# Patient Record
Sex: Male | Born: 1940
Health system: Southern US, Community
[De-identification: ages and names within clinical notes are randomized; demographics above are authoritative.]

## PROBLEM LIST (undated history)

## (undated) DIAGNOSIS — R319 Hematuria, unspecified: Secondary | ICD-10-CM

## (undated) DIAGNOSIS — E119 Type 2 diabetes mellitus without complications: Secondary | ICD-10-CM

## (undated) DIAGNOSIS — M1990 Unspecified osteoarthritis, unspecified site: Secondary | ICD-10-CM

## (undated) DIAGNOSIS — K429 Umbilical hernia without obstruction or gangrene: Secondary | ICD-10-CM

## (undated) DIAGNOSIS — E785 Hyperlipidemia, unspecified: Secondary | ICD-10-CM

## (undated) HISTORY — DX: Hematuria, unspecified: R31.9

## (undated) HISTORY — DX: Umbilical hernia without obstruction or gangrene: K42.9

## (undated) HISTORY — DX: Unspecified osteoarthritis, unspecified site: M19.90

## (undated) HISTORY — DX: Type 2 diabetes mellitus without complications: E11.9

## (undated) HISTORY — DX: Hyperlipidemia, unspecified: E78.5

---

## 2001-07-28 HISTORY — PX: CATARACT EXTRACTION: SUR2

## 2008-11-02 ENCOUNTER — Ambulatory Visit: Payer: Self-pay | Admitting: Gastroenterology

## 2008-11-02 LAB — HM COLONOSCOPY

## 2012-10-27 ENCOUNTER — Ambulatory Visit: Payer: Self-pay | Admitting: Surgery

## 2012-10-27 LAB — BASIC METABOLIC PANEL
Chloride: 103 mmol/L (ref 98–107)
Co2: 28 mmol/L (ref 21–32)
Creatinine: 1.18 mg/dL (ref 0.60–1.30)
EGFR (Non-African Amer.): >60
Glucose: 145 mg/dL — ABNORMAL HIGH (ref 65–99)
Osmolality: 277 (ref 275–301)
Potassium: 3.9 mmol/L (ref 3.5–5.1)
Sodium: 137 mmol/L (ref 136–145)

## 2012-11-03 ENCOUNTER — Ambulatory Visit: Payer: Self-pay | Admitting: Surgery

## 2013-07-28 HISTORY — PX: HERNIA REPAIR: SHX51

## 2014-05-22 LAB — PSA: PSA: 1.6

## 2014-11-13 DIAGNOSIS — E119 Type 2 diabetes mellitus without complications: Secondary | ICD-10-CM | POA: Diagnosis not present

## 2014-11-13 DIAGNOSIS — E784 Other hyperlipidemia: Secondary | ICD-10-CM | POA: Diagnosis not present

## 2014-11-13 DIAGNOSIS — Z1389 Encounter for screening for other disorder: Secondary | ICD-10-CM | POA: Diagnosis not present

## 2014-11-13 DIAGNOSIS — I10 Essential (primary) hypertension: Secondary | ICD-10-CM | POA: Diagnosis not present

## 2014-11-13 DIAGNOSIS — Z0000 Encounter for general adult medical examination without abnormal findings: Secondary | ICD-10-CM | POA: Diagnosis not present

## 2014-11-13 DIAGNOSIS — Z23 Encounter for immunization: Secondary | ICD-10-CM | POA: Diagnosis not present

## 2014-11-13 LAB — HEPATIC FUNCTION PANEL
ALT: 14 U/L (ref 10–40)
AST: 21 U/L (ref 14–40)

## 2014-11-13 LAB — BASIC METABOLIC PANEL
BUN: 19 mg/dL (ref 4–21)
CREATININE: 1.2 mg/dL (ref <=1.3)
GLUCOSE: 119 mg/dL

## 2014-11-13 LAB — LIPID PANEL
Cholesterol: 137 mg/dL (ref 0–200)
HDL: 34 mg/dL — AB (ref 35–70)
LDL Cholesterol: 85 mg/dL
TRIGLYCERIDES: 90 mg/dL (ref 40–160)

## 2014-11-13 LAB — HEMOGLOBIN A1C: HEMOGLOBIN A1C: 6.1 % — AB (ref 4.0–6.0)

## 2014-11-13 LAB — FECAL OCCULT BLOOD, GUAIAC: Fecal Occult Blood: NEGATIVE

## 2014-11-17 NOTE — Op Note (Signed)
PATIENT NAME:  Troy Rodriguez, Troy Rodriguez MR#:  [id number] DATE OF BIRTH:  July 15, 1941  DATE OF PROCEDURE:  11/03/2012  ATTENDING PHYSICIAN:  Dr. Marlyce Huge.  PREOPERATIVE DIAGNOSIS: Large umbilical hernia, with incarcerated hernia.  POSTOPERATIVE DIAGNOSIS: Large umbilical hernia, with incarcerated hernia.  PROCEDURE PERFORMED: Open umbilical hernia repair, with mesh and partial omentectomy.   ESTIMATED BLOOD LOSS: 30 mL.   SPECIMEN: Hernia sac and omentum.   INDICATION FOR SURGERY: The patient is a pleasant 74 year old male who had a large hernia with some skin thinning and possible skin breakdown, and risk for a possible hernia skin breakdown who presented to my clinic desiring a hernia repair.   I thus brought him to the operating room for hernia repair.   DETAILS OF PROCEDURE: The patient was brought to the operating room suite after informed consent was obtained. He was laid supine on the operating room table. His abdomen was prepped there. He was induced. Endotracheal tube was placed. General anesthesia was administered. His abdomen was prepped and draped in a standard surgical fashion. A time-out  was then performed, correctly identifying the patient's name, operative site and procedure to be performed.   Due to the size of the hernia I was afraid that I would not be able to get around the stalk using a transverse incision, therefore I made a lateral periumbilical incision. This was deepened down to the fascia. The hernia sac was easily identified. I then encircled the hernia sac and there was a very large sac, an approximately 2 x 2 cm fascial defect. I then carefully dissected the sac away from the herniated omentum. I then carefully reduced the herniated omentum and I did end up having to remove a small amount of it due to the fact that it had become friable trying to put it in with numerous attempts. I thus placed a Kelly around the omentum and tied off, removed it, and then ligated and  then tied with a 2-0 silk suture. Once all the omentum was then reduced  I cleared off the adjacent fascia and the underside of the fascial defect. I then used a 6 cm Ventralex patch to reinforce the hernia repair and then closed the hernia transversely using a  0 Ethibond suture, incorporating the mesh tails into the closure. All the sutures were placed and tied afterwards. The tails of the mesh were then excised. The wound was then irrigated. I then examined the skin, which was extremely thin, and I thought it had a high risk for necrosis. Therefore, I trimmed the excessive skin back to healthy skin. I then tacked the skin down to the repair using a 3-0 Vicryl. It did not approximate the bellybutton, but did not cosmetically have the appearance of a normal bellybutton, however I felt that putting compromised skin together would put the patient at risk for a wound infection and mesh infection.   I then closed the wound with staples. I then placed a sterile dressing and Tegaderm over the wound. The patient was then awakened, extubated and brought to the postanesthesia care unit. There were no immediate complications.   Needle, sponge, and instrument counts were correct at the end of the procedure.     ____________________________ Glena Norfolk. Lashauna Arpin, MD cal:dm D: 11/03/2012 14:16:49 ET T: 11/03/2012 14:45:29 ET JOB#: [id number]  cc: Harrell Gave A. Wilmot Quevedo, MD, <Dictator> Floyde Parkins MD ELECTRONICALLY SIGNED 11/03/2012 16:24

## 2014-12-08 DIAGNOSIS — I10 Essential (primary) hypertension: Secondary | ICD-10-CM | POA: Insufficient documentation

## 2014-12-08 DIAGNOSIS — Z0000 Encounter for general adult medical examination without abnormal findings: Secondary | ICD-10-CM | POA: Insufficient documentation

## 2014-12-08 DIAGNOSIS — E669 Obesity, unspecified: Secondary | ICD-10-CM | POA: Insufficient documentation

## 2014-12-08 DIAGNOSIS — R319 Hematuria, unspecified: Secondary | ICD-10-CM | POA: Insufficient documentation

## 2014-12-08 DIAGNOSIS — E118 Type 2 diabetes mellitus with unspecified complications: Secondary | ICD-10-CM | POA: Insufficient documentation

## 2014-12-08 DIAGNOSIS — E7849 Other hyperlipidemia: Secondary | ICD-10-CM | POA: Insufficient documentation

## 2014-12-08 DIAGNOSIS — Z1331 Encounter for screening for depression: Secondary | ICD-10-CM | POA: Insufficient documentation

## 2014-12-20 ENCOUNTER — Encounter: Payer: Self-pay | Admitting: *Deleted

## 2014-12-20 DIAGNOSIS — Z23 Encounter for immunization: Secondary | ICD-10-CM | POA: Insufficient documentation

## 2014-12-20 DIAGNOSIS — Z8639 Personal history of other endocrine, nutritional and metabolic disease: Secondary | ICD-10-CM | POA: Insufficient documentation

## 2014-12-20 DIAGNOSIS — K429 Umbilical hernia without obstruction or gangrene: Secondary | ICD-10-CM | POA: Insufficient documentation

## 2015-01-09 ENCOUNTER — Other Ambulatory Visit: Payer: Self-pay | Admitting: Family Medicine

## 2015-01-09 DIAGNOSIS — E785 Hyperlipidemia, unspecified: Secondary | ICD-10-CM

## 2015-01-15 ENCOUNTER — Ambulatory Visit: Payer: Medicare Other | Admitting: Urology

## 2015-01-18 ENCOUNTER — Encounter: Payer: Self-pay | Admitting: Urology

## 2015-01-18 ENCOUNTER — Ambulatory Visit (INDEPENDENT_AMBULATORY_CARE_PROVIDER_SITE_OTHER): Payer: Medicare Other | Admitting: Urology

## 2015-01-18 VITALS — BP 146/74 | HR 78 | Ht 73.0 in | Wt 289.6 lb

## 2015-01-18 DIAGNOSIS — R312 Other microscopic hematuria: Secondary | ICD-10-CM

## 2015-01-18 DIAGNOSIS — R3129 Other microscopic hematuria: Secondary | ICD-10-CM

## 2015-01-18 LAB — URINALYSIS, COMPLETE
Bilirubin, UA: NEGATIVE
Glucose, UA: NEGATIVE
Ketones, UA: NEGATIVE
Leukocytes, UA: NEGATIVE
Nitrite, UA: NEGATIVE
PH UA: 5 (ref 5.0–7.5)
Protein, UA: NEGATIVE
Specific Gravity, UA: 1.02 (ref 1.005–1.030)
Urobilinogen, Ur: 0.2 mg/dL (ref 0.2–1.0)

## 2015-01-18 LAB — MICROSCOPIC EXAMINATION

## 2015-01-18 NOTE — Progress Notes (Signed)
01/18/2015 9:09 AM   Troy Rodriguez 1941/06/23 [id number]  Referring provider: No referring provider defined for this encounter.  Chief Complaint  Patient presents with  . Hematuria    Refferal from Troy Miu MD    HPI: Troy Rodriguez is a 74 year old white male who is referred to [place] for blood in the urine.  Patient denies any gross hematuria.  I can only find uirne dips + for blood in his previous lab work.  He does have 3-10RBC's/hpf on today's UA.    His baseline line urinary symptoms are infrequent nocturia and infrequent intermittency.  He has not had a history of UTI's, stones or GU malignancies.  He does not have a FM Hx of stones, but his brother was diagnosed with PCa in his 59's.  Patient's most recent PSA from his PCP's office was 1.6 ng/mL on 05/22/2014.    He is currently not experiencing any flank pain, fevers, chills, nausea, vomiting or dysuria.    PSA History:    1.6 ng/mL on 05/22/2014  PMH: Past Medical History  Diagnosis Date  . Arthritis   . Diabetes   . HLD (hyperlipidemia)   . Umbilical hernia   . Hematuria     Surgical History: Past Surgical History  Procedure Laterality Date  . Cataract extraction Bilateral 2003  . Hernia repair  2015    Home Medications:    Medication List       This list is accurate as of: 01/18/15  9:09 AM.  Always use your most recent med list.               aspirin 81 MG tablet  Take 1 tablet by mouth daily.     atorvastatin 40 MG tablet  Commonly known as:  LIPITOR  Take 1 tablet by mouth daily.     pioglitazone 30 MG tablet  Commonly known as:  ACTOS  Take 1 tablet by mouth daily.     VYTORIN 10-40 MG per tablet  Generic drug:  ezetimibe-simvastatin  TAKE 1 TABLET BY MOUTH DAILY        Allergies: No Known Allergies  Family History: Family History  Problem Relation Age of Onset  . Prostate cancer Brother   . Kidney disease Neg Hx     Social History:  reports that he has quit smoking. His  smokeless tobacco use includes Chew. He reports that he does not drink alcohol or use illicit drugs.  ROS: Urological Symptom Review  Patient is experiencing the following symptoms: Get up at night to urinate Trouble starting stream   Review of Systems  Gastrointestinal (upper)  : Negative for upper GI symptoms  Gastrointestinal (lower) : Negative for lower GI symptoms  Constitutional : Negative for symptoms  Skin: Negative for skin symptoms  Eyes: Negative for eye symptoms  Ear/Nose/Throat : Negative for Ear/Nose/Throat symptoms  Hematologic/Lymphatic: Easy bruising  Cardiovascular : Negative for cardiovascular symptoms  Respiratory : Negative for respiratory symptoms  Endocrine: Negative for endocrine symptoms  Musculoskeletal: Joint pain  Neurological: Negative for neurological symptoms  Psychologic: Negative for psychiatric symptoms   Physical Exam: BP 146/74 mmHg  Pulse 78  Ht 6\' 1"  (1.854 m)  Wt 289 lb 9.6 oz (131.362 kg)  BMI 38.22 kg/m2  Constitutional:  Alert and oriented, No acute distress. HEENT: [place] AT, moist mucus membranes.  Trachea midline, no masses. Cardiovascular: No clubbing, cyanosis, or edema. Respiratory: Normal respiratory effort, no increased work of breathing. GI: Abdomen is soft,  nontender, nondistended, no abdominal masses GU: No CVA tenderness. Patient with uncircumcised phallus. Foreskin easily retracted  Urethral meatus is patent.  No penile discharge. Mild balanitis. Scrotum without lesions, cysts, rashes and/or edema.  Testicles are located scrotally bilaterally. No masses are appreciated in the testicles. Left and right epididymis are normal. Rectal: Patient with  normal sphincter tone. Perineum without scarring or rashes. No rectal masses are appreciated. Could not palpate the prostate due to buttocks tissue.   Skin: No rashes, bruises or suspicious lesions. Lymph: No cervical or inguinal adenopathy. Neurologic:  Grossly intact, no focal deficits, moving all 4 extremities. Psychiatric: Normal mood and affect.  Laboratory Data: Results for orders placed or performed in visit on 01/18/15  Microscopic Examination  Result Value Ref Range   WBC, UA 0-5 0 -  5 /hpf   RBC, UA 3-10 (A) 0 -  2 /hpf   Epithelial Cells (non renal) 0-10 0 - 10 /hpf   Bacteria, UA Few None seen/Few  Urinalysis, Complete  Result Value Ref Range   Specific Gravity, UA 1.020 1.005 - 1.030   pH, UA 5.0 5.0 - 7.5   Color, UA Yellow Yellow   Appearance Ur Clear Clear   Leukocytes, UA Negative Negative   Protein, UA Negative Negative/Trace   Glucose, UA Negative Negative   Ketones, UA Negative Negative   RBC, UA 2+ (A) Negative   Bilirubin, UA Negative Negative   Urobilinogen, Ur 0.2 0.2 - 1.0 mg/dL   Nitrite, UA Negative Negative   Microscopic Examination See below:    No results found for: WBC, HGB, HCT, MCV, PLT  Lab Results  Component Value Date   CREATININE 1.2 11/13/2014    Lab Results  Component Value Date   PSA 1.6 05/22/2014    No results found for: TESTOSTERONE  Lab Results  Component Value Date   HGBA1C 6.1* 11/13/2014    Urinalysis No results found for: COLORURINE, APPEARANCEUR, LABSPEC, PHURINE, GLUCOSEU, HGBUR, BILIRUBINUR, KETONESUR, PROTEINUR, UROBILINOGEN, NITRITE, LEUKOCYTESUR  Pertinent Imaging:  Assessment & Plan:    1. Microscopic hematuria:  Patient does not have a microscopic ua from the PCP's office, only +dips for blood.  He has 3-10RBC's/hpf on today's UA.   Explained to the patient that he does not yet meet the critera for a hematuria work up and that I would like to check his urine again next week.  If blood is found in the second UA, we will pursue a hematuria workup.  Explained to patient the causes of blood in the urine are as follows: stones, BPH, UTI's, damage to the urinary tract and/or cancer.  I will schedule  a CT Urogram with contrast material if he has RBC's/hpf on his  UA next week and that in rare instances, an allergic reaction can be serious and even life threatening with the injection of contrast material.   The patient denies any allergies to contrast, iodine and/or seafood and is not taking metformin.  - Urinalysis, Complete - CULTURE, URINE COMPREHENSIVE   No Follow-up on file.  Zara Council, [facility] [address] [place], [place] [address] [phone]

## 2015-01-20 LAB — CULTURE, URINE COMPREHENSIVE

## 2015-01-23 ENCOUNTER — Telehealth: Payer: Self-pay | Admitting: Urology

## 2015-01-23 ENCOUNTER — Other Ambulatory Visit: Payer: Medicare Other

## 2015-01-23 DIAGNOSIS — R3129 Other microscopic hematuria: Secondary | ICD-10-CM

## 2015-01-23 DIAGNOSIS — R312 Other microscopic hematuria: Secondary | ICD-10-CM | POA: Diagnosis not present

## 2015-01-23 LAB — URINALYSIS, COMPLETE
BILIRUBIN UA: NEGATIVE
GLUCOSE, UA: NEGATIVE
KETONES UA: NEGATIVE
Leukocytes, UA: NEGATIVE
Nitrite, UA: NEGATIVE
Protein, UA: NEGATIVE
SPEC GRAV UA: 1.02 (ref 1.005–1.030)
Urobilinogen, Ur: 0.2 mg/dL (ref 0.2–1.0)
pH, UA: 5.5 (ref 5.0–7.5)

## 2015-01-23 LAB — MICROSCOPIC EXAMINATION: Bacteria, UA: NONE SEEN

## 2015-01-23 NOTE — Telephone Encounter (Signed)
Patient still had micro heme.  We need to schedule a CT Urogram.

## 2015-01-24 NOTE — Telephone Encounter (Signed)
Office visit for the report before the cysto.

## 2015-01-24 NOTE — Telephone Encounter (Signed)
I placed the order for the CTscan to be scheduled, does the patient need follow up apt after? Or Cysto?

## 2015-01-24 NOTE — Telephone Encounter (Signed)
Can you please schedule pt and let them know. Thanks

## 2015-02-01 ENCOUNTER — Ambulatory Visit
Admission: RE | Admit: 2015-02-01 | Discharge: 2015-02-01 | Disposition: A | Discharge status: Home / Self Care | Payer: Medicare Other | Source: Ambulatory Visit | Attending: Urology | Admitting: Urology

## 2015-02-01 DIAGNOSIS — I700 Atherosclerosis of aorta: Secondary | ICD-10-CM | POA: Insufficient documentation

## 2015-02-01 DIAGNOSIS — K5730 Diverticulosis of large intestine without perforation or abscess without bleeding: Secondary | ICD-10-CM | POA: Insufficient documentation

## 2015-02-01 DIAGNOSIS — R312 Other microscopic hematuria: Secondary | ICD-10-CM | POA: Diagnosis present

## 2015-02-01 DIAGNOSIS — R319 Hematuria, unspecified: Secondary | ICD-10-CM | POA: Diagnosis not present

## 2015-02-01 DIAGNOSIS — R3129 Other microscopic hematuria: Secondary | ICD-10-CM

## 2015-02-01 MED ORDER — IOHEXOL 300 MG/ML  SOLN
125.0000 mL | Freq: Once | INTRAMUSCULAR | Status: AC | PRN
  Administered 2015-02-01: 125 mL via INTRAVENOUS

## 2015-02-08 NOTE — Telephone Encounter (Signed)
Lattie Haw scheduled patient for an ov for CT results on July 26th at 1:45. Called patient and informed him.

## 2015-02-20 ENCOUNTER — Ambulatory Visit (INDEPENDENT_AMBULATORY_CARE_PROVIDER_SITE_OTHER): Payer: Medicare Other | Admitting: Urology

## 2015-02-20 ENCOUNTER — Encounter: Payer: Self-pay | Admitting: Urology

## 2015-02-20 VITALS — BP 127/71 | HR 65 | Temp 97.6°F | Ht 73.0 in

## 2015-02-20 DIAGNOSIS — R312 Other microscopic hematuria: Secondary | ICD-10-CM | POA: Diagnosis not present

## 2015-02-20 DIAGNOSIS — R3129 Other microscopic hematuria: Secondary | ICD-10-CM

## 2015-02-20 DIAGNOSIS — R319 Hematuria, unspecified: Secondary | ICD-10-CM | POA: Diagnosis not present

## 2015-02-20 LAB — URINALYSIS, COMPLETE
Bilirubin, UA: NEGATIVE
Glucose, UA: NEGATIVE
Leukocytes, UA: NEGATIVE
NITRITE UA: NEGATIVE
Protein, UA: NEGATIVE
Specific Gravity, UA: 1.025 (ref 1.005–1.030)
Urobilinogen, Ur: 0.2 mg/dL (ref 0.2–1.0)
pH, UA: 5 (ref 5.0–7.5)

## 2015-02-20 LAB — MICROSCOPIC EXAMINATION: BACTERIA UA: NONE SEEN

## 2015-02-20 NOTE — Progress Notes (Signed)
4:55 PM   Troy Rodriguez April 28, 1941 [id number]  Referring provider: Juline Patch, MD [address] [place], [place] [address]  Chief Complaint  Patient presents with  . Follow-up    CT urogram for hematuria     HPI: Troy Rodriguez is a 74 year old white male who has completed his CT Urogram for the first part of his work up for microscopic hematuria. He has not experienced any gross hematuria.  He is on Actos for his diabetes and has been for ten years.    CT Urogram is reviewed with the patient and his wife.  The ureters were not found to be completely opacified on today's exam.  His baseline line urinary symptoms are infrequent nocturia and infrequent intermittency.  He has not had a history of UTI's, stones or GU malignancies.  He does not have a FM Hx of stones, but his brother was diagnosed with PCa in his 33's.  Patient's most recent PSA from his PCP's office was 1.6 ng/mL on 05/22/2014.    He is currently not experiencing any flank pain, fevers, chills, nausea, vomiting or dysuria.  He is still having microscopic hematuria on today's UA.   PSA History:    1.6 ng/mL on 05/22/2014  PMH: Past Medical History  Diagnosis Date  . Arthritis   . Diabetes   . HLD (hyperlipidemia)   . Umbilical hernia   . Hematuria   . Hypertension     Surgical History: Past Surgical History  Procedure Laterality Date  . Cataract extraction Bilateral 2003  . Hernia repair  2015    Home Medications:    Medication List       This list is accurate as of: 02/20/15  4:55 PM.  Always use your most recent med list.               aspirin 81 MG tablet  Take 1 tablet by mouth daily.     atorvastatin 40 MG tablet  Commonly known as:  LIPITOR  Take 1 tablet by mouth daily.     pioglitazone 30 MG tablet  Commonly known as:  ACTOS  Take 1 tablet by mouth daily.     VYTORIN 10-40 MG per tablet  Generic drug:  ezetimibe-simvastatin  TAKE 1 TABLET BY MOUTH DAILY         Allergies: No Known Allergies  Family History: Family History  Problem Relation Age of Onset  . Prostate cancer Brother   . Kidney disease Neg Hx     Social History:  reports that he has quit smoking. His smokeless tobacco use includes Chew. He reports that he does not drink alcohol or use illicit drugs.  ROS: Urological Symptom Review  Patient is experiencing the following symptoms: Get up at night to urinate Trouble starting stream   Review of Systems  Gastrointestinal (upper)  : Negative for upper GI symptoms  Gastrointestinal (lower) : Negative for lower GI symptoms  Constitutional : Negative for symptoms  Skin: Negative for skin symptoms  Eyes: Negative for eye symptoms  Ear/Nose/Throat : Negative for Ear/Nose/Throat symptoms  Hematologic/Lymphatic: Easy bruising  Cardiovascular : Negative for cardiovascular symptoms  Respiratory : Negative for respiratory symptoms  Endocrine: Negative for endocrine symptoms  Musculoskeletal: Joint pain  Neurological: Negative for neurological symptoms  Psychologic: Negative for psychiatric symptoms   Physical Exam: BP 127/71 mmHg  Pulse 65  Temp(Src) 97.6 F (36.4 C) (Oral)  Ht 6\' 1"  (1.854 m)  Constitutional:  Alert and oriented, No acute distress. HEENT: [place] AT, moist mucus membranes.  Trachea midline, no masses. Cardiovascular: No clubbing, cyanosis, or edema. Respiratory: Normal respiratory effort, no increased work of breathing. GI: Abdomen is soft, nontender, nondistended, no abdominal masses GU: No CVA tenderness.  Skin: No rashes, bruises or suspicious lesions. Lymph: No cervical or inguinal adenopathy. Neurologic: Grossly intact, no focal deficits, moving all 4 extremities. Psychiatric: Normal mood and affect.  Laboratory Data: Results for orders placed or performed in visit on 02/20/15  Microscopic Examination  Result Value Ref Range   WBC, UA 0-5 0 -  5 /hpf   RBC, UA 3-10 (A) 0  -  2 /hpf   Epithelial Cells (non renal) 0-10 0 - 10 /hpf   Bacteria, UA None seen None seen/Few  Urinalysis, Complete  Result Value Ref Range   Specific Gravity, UA 1.025 1.005 - 1.030   pH, UA 5.0 5.0 - 7.5   Color, UA Yellow Yellow   Appearance Ur Clear Clear   Leukocytes, UA Negative Negative   Protein, UA Negative Negative/Trace   Glucose, UA Negative Negative   Ketones, UA Trace (A) Negative   RBC, UA 1+ (A) Negative   Bilirubin, UA Negative Negative   Urobilinogen, Ur 0.2 0.2 - 1.0 mg/dL   Nitrite, UA Negative Negative   Microscopic Examination See below:     Results for orders placed or performed in visit on 02/20/15  Microscopic Examination  Result Value Ref Range   WBC, UA 0-5 0 -  5 /hpf   RBC, UA 3-10 (A) 0 -  2 /hpf   Epithelial Cells (non renal) 0-10 0 - 10 /hpf   Bacteria, UA None seen None seen/Few  Urinalysis, Complete  Result Value Ref Range   Specific Gravity, UA 1.025 1.005 - 1.030   pH, UA 5.0 5.0 - 7.5   Color, UA Yellow Yellow   Appearance Ur Clear Clear   Leukocytes, UA Negative Negative   Protein, UA Negative Negative/Trace   Glucose, UA Negative Negative   Ketones, UA Trace (A) Negative   RBC, UA 1+ (A) Negative   Bilirubin, UA Negative Negative   Urobilinogen, Ur 0.2 0.2 - 1.0 mg/dL   Nitrite, UA Negative Negative   Microscopic Examination See below:    No results found for: WBC, HGB, HCT, MCV, PLT  Lab Results  Component Value Date   CREATININE 1.2 11/13/2014    Lab Results  Component Value Date   PSA 1.6 05/22/2014    No results found for: TESTOSTERONE  Lab Results  Component Value Date   HGBA1C 6.1* 11/13/2014    Urinalysis    Component Value Date/Time   GLUCOSEU Negative 02/20/2015 1406   BILIRUBINUR Negative 02/20/2015 1406   NITRITE Negative 02/20/2015 1406   LEUKOCYTESUR Negative 02/20/2015 1406    Pertinent Imaging:  Assessment & Plan:    1. Microscopic hematuria:  Patient has completed his CT Urogram and no  etiology is found for his hematuria, but his ureters did not completely opacify on the exam.  He is at risk for bladder cancer with his long term use of Actos for his diabetes.    I discussed with the patient that a cystoscopy with RTG's would be the next step in his work up.    I described to the patient how the procedure is performed and the risk associated with the procedure, such as: Infection, bleeding, uncomfortableness for the first few days after the procedure, the possibility of a  biopsy of an area of concern in the ureters or bladder and the possibility of a stent placement.  He voiced his understanding of this procedure and his questions were answered.  He is instructed to stop his ASA at this time.    - Urinalysis, Complete - CULTURE, URINE COMPREHENSIVE   Return for cystoscopy with RTG's .  Zara Council, [facility] [address] [place], [place] [address] [phone]

## 2015-02-21 LAB — BASIC METABOLIC PANEL
BUN/Creatinine Ratio: 15 (ref 10–22)
BUN: 18 mg/dL (ref 8–27)
CALCIUM: 9.4 mg/dL (ref 8.6–10.2)
CHLORIDE: 102 mmol/L (ref 97–108)
CO2: 25 mmol/L (ref 18–29)
Creatinine, Ser: 1.18 mg/dL (ref 0.76–1.27)
GFR calc Af Amer: 69 mL/min/{1.73_m2} (ref >59)
GFR calc non Af Amer: 60 mL/min/{1.73_m2} (ref >59)
Glucose: 149 mg/dL — ABNORMAL HIGH (ref 65–99)
POTASSIUM: 4.1 mmol/L (ref 3.5–5.2)
Sodium: 140 mmol/L (ref 134–144)

## 2015-02-21 LAB — CBC WITH DIFFERENTIAL/PLATELET
BASOS: 1 %
Basophils Absolute: 0 10*3/uL (ref 0.0–0.2)
EOS (ABSOLUTE): 0.1 10*3/uL (ref 0.0–0.4)
Eos: 2 %
HEMATOCRIT: 45.6 % (ref 37.5–51.0)
HEMOGLOBIN: 15.1 g/dL (ref 12.6–17.7)
Immature Grans (Abs): 0 10*3/uL (ref 0.0–0.1)
Immature Granulocytes: 0 %
LYMPHS: 22 %
Lymphocytes Absolute: 1.1 10*3/uL (ref 0.7–3.1)
MCH: 30.1 pg (ref 26.6–33.0)
MCHC: 33.1 g/dL (ref 31.5–35.7)
MCV: 91 fL (ref 79–97)
MONOS ABS: 0.4 10*3/uL (ref 0.1–0.9)
Monocytes: 9 %
Neutrophils Absolute: 3.1 10*3/uL (ref 1.4–7.0)
Neutrophils: 66 %
Platelets: 176 10*3/uL (ref 150–379)
RBC: 5.01 x10E6/uL (ref 4.14–5.80)
RDW: 14.3 % (ref 12.3–15.4)
WBC: 4.7 10*3/uL (ref 3.4–10.8)

## 2015-02-22 ENCOUNTER — Other Ambulatory Visit: Payer: Self-pay

## 2015-02-22 ENCOUNTER — Telehealth: Payer: Self-pay

## 2015-02-22 DIAGNOSIS — N390 Urinary tract infection, site not specified: Secondary | ICD-10-CM

## 2015-02-22 LAB — CULTURE, URINE COMPREHENSIVE

## 2015-02-22 MED ORDER — AMOXICILLIN-POT CLAVULANATE 875-125 MG PO TABS: 1.0000 | ORAL_TABLET | Freq: Two times a day (BID) | ORAL | Status: AC

## 2015-02-22 NOTE — Telephone Encounter (Signed)
Spoke with pt and made aware of infection. Pt voiced understanding. Medication was called into pharmacy.

## 2015-02-22 NOTE — Telephone Encounter (Signed)
-----   Message from Nori Riis, PA-C sent at 02/22/2015  2:01 PM EDT ----- Patient has a +UCx.  They need to start Augmentin 875/125  one  twice daily for until his procedure and then we need to check a specimen in 3 to 5 days after they complete their antibiotics.

## 2015-02-23 ENCOUNTER — Encounter
Admission: RE | Admit: 2015-02-23 | Discharge: 2015-02-23 | Disposition: A | Discharge status: Home / Self Care | Payer: Medicare Other | Source: Ambulatory Visit | Attending: Urology | Admitting: Urology

## 2015-02-23 DIAGNOSIS — R312 Other microscopic hematuria: Secondary | ICD-10-CM | POA: Diagnosis not present

## 2015-02-23 DIAGNOSIS — E785 Hyperlipidemia, unspecified: Secondary | ICD-10-CM | POA: Diagnosis not present

## 2015-02-23 DIAGNOSIS — E119 Type 2 diabetes mellitus without complications: Secondary | ICD-10-CM | POA: Diagnosis not present

## 2015-02-23 DIAGNOSIS — M1990 Unspecified osteoarthritis, unspecified site: Secondary | ICD-10-CM | POA: Diagnosis not present

## 2015-02-23 DIAGNOSIS — Z720 Tobacco use: Secondary | ICD-10-CM | POA: Diagnosis not present

## 2015-02-23 DIAGNOSIS — N471 Phimosis: Secondary | ICD-10-CM | POA: Diagnosis not present

## 2015-02-23 DIAGNOSIS — Z87891 Personal history of nicotine dependence: Secondary | ICD-10-CM | POA: Diagnosis not present

## 2015-02-23 DIAGNOSIS — Z9841 Cataract extraction status, right eye: Secondary | ICD-10-CM | POA: Diagnosis not present

## 2015-02-23 DIAGNOSIS — Z79899 Other long term (current) drug therapy: Secondary | ICD-10-CM | POA: Diagnosis not present

## 2015-02-23 DIAGNOSIS — I10 Essential (primary) hypertension: Secondary | ICD-10-CM | POA: Diagnosis not present

## 2015-02-23 DIAGNOSIS — Z9842 Cataract extraction status, left eye: Secondary | ICD-10-CM | POA: Diagnosis not present

## 2015-02-23 DIAGNOSIS — Z01810 Encounter for preprocedural cardiovascular examination: Secondary | ICD-10-CM | POA: Diagnosis not present

## 2015-02-23 DIAGNOSIS — Z8042 Family history of malignant neoplasm of prostate: Secondary | ICD-10-CM | POA: Diagnosis not present

## 2015-02-23 DIAGNOSIS — Z841 Family history of disorders of kidney and ureter: Secondary | ICD-10-CM | POA: Diagnosis not present

## 2015-02-23 DIAGNOSIS — Z7982 Long term (current) use of aspirin: Secondary | ICD-10-CM | POA: Diagnosis not present

## 2015-02-23 DIAGNOSIS — R319 Hematuria, unspecified: Secondary | ICD-10-CM | POA: Diagnosis present

## 2015-02-23 NOTE — Patient Instructions (Signed)
  Your procedure is scheduled on: 02-26-15 Report to [facility] To find out your arrival time please call [phone] between 1PM - 3PM on 02-23-15   Remember: Instructions that are not followed completely may result in serious medical risk, up to and including death, or upon the discretion of your surgeon and anesthesiologist your surgery may need to be rescheduled.    __X__ 1. Do not eat food or drink liquids after midnight. No gum chewing or hard candies.     __X__ 2. No Alcohol for 24 hours before or after surgery.   ____ 3. Bring all medications with you on the day of surgery if instructed.    _X___ 4. Notify your doctor if there is any change in your medical condition     (cold, fever, infections).     Do not wear jewelry, make-up, hairpins, clips or nail polish.  Do not wear lotions, powders, or perfumes. You may wear deodorant.  Do not shave 48 hours prior to surgery. Men may shave face and neck.  Do not bring valuables to the hospital.    [facility] is not responsible for any belongings or valuables.               Contacts, dentures or bridgework may not be worn into surgery.  Leave your suitcase in the car. After surgery it may be brought to your room.  For patients admitted to the hospital, discharge time is determined by your  treatment team.   Patients discharged the day of surgery will not be allowed to drive home.   Please read over the following fact sheets that you were given:      _X___ Take these medicines the morning of surgery with A SIP OF WATER:    1. LIPITOR  2.   3.   4.  5.  6.  ____ Fleet Enema (as directed)   ____ Use CHG Soap as directed  ____ Use inhalers on the day of surgery  ____ Stop metformin 2 days prior to surgery    ____ Take 1/2 of usual insulin dose the night before surgery and none on the morning of surgery.   ____ Stop Coumadin/Plavix/aspirin-PT STOPPED ASPIRIN ON 02-20-15  ____ Stop  Anti-inflammatories-NO NSAIDS OR ASPIRIN PRODUCTS-TYLENOL OK   __X__ Stop supplements until after surgery-STOP FISH OIL NOW  ____ Bring C-Pap to the hospital.

## 2015-02-23 NOTE — Pre-Procedure Instructions (Signed)
Ekg sent to Dr Ronelle Nigh for review- Dr Ronelle Nigh reviewed and said pt ok to proceed with surgery.

## 2015-02-26 ENCOUNTER — Ambulatory Visit
Admission: RE | Admit: 2015-02-26 | Discharge: 2015-02-26 | Disposition: A | Discharge status: Home / Self Care | Payer: Medicare Other | Source: Ambulatory Visit | Attending: Urology | Admitting: Urology

## 2015-02-26 ENCOUNTER — Ambulatory Visit: Payer: Medicare Other | Admitting: Anesthesiology

## 2015-02-26 ENCOUNTER — Encounter: Payer: Self-pay | Admitting: Urology

## 2015-02-26 ENCOUNTER — Ambulatory Visit (INDEPENDENT_AMBULATORY_CARE_PROVIDER_SITE_OTHER): Payer: Medicare Other

## 2015-02-26 ENCOUNTER — Encounter
Admission: RE | Disposition: A | Discharge status: Home / Self Care | Payer: Self-pay | Source: Ambulatory Visit | Attending: Urology

## 2015-02-26 DIAGNOSIS — M1990 Unspecified osteoarthritis, unspecified site: Secondary | ICD-10-CM | POA: Diagnosis not present

## 2015-02-26 DIAGNOSIS — R319 Hematuria, unspecified: Secondary | ICD-10-CM

## 2015-02-26 DIAGNOSIS — Z8042 Family history of malignant neoplasm of prostate: Secondary | ICD-10-CM | POA: Insufficient documentation

## 2015-02-26 DIAGNOSIS — E785 Hyperlipidemia, unspecified: Secondary | ICD-10-CM | POA: Diagnosis not present

## 2015-02-26 DIAGNOSIS — N471 Phimosis: Secondary | ICD-10-CM | POA: Diagnosis not present

## 2015-02-26 DIAGNOSIS — E119 Type 2 diabetes mellitus without complications: Secondary | ICD-10-CM | POA: Insufficient documentation

## 2015-02-26 DIAGNOSIS — Z87891 Personal history of nicotine dependence: Secondary | ICD-10-CM | POA: Insufficient documentation

## 2015-02-26 DIAGNOSIS — Z79899 Other long term (current) drug therapy: Secondary | ICD-10-CM | POA: Insufficient documentation

## 2015-02-26 DIAGNOSIS — Z9841 Cataract extraction status, right eye: Secondary | ICD-10-CM | POA: Diagnosis not present

## 2015-02-26 DIAGNOSIS — Z9842 Cataract extraction status, left eye: Secondary | ICD-10-CM | POA: Diagnosis not present

## 2015-02-26 DIAGNOSIS — I10 Essential (primary) hypertension: Secondary | ICD-10-CM | POA: Insufficient documentation

## 2015-02-26 DIAGNOSIS — R339 Retention of urine, unspecified: Secondary | ICD-10-CM

## 2015-02-26 DIAGNOSIS — N35011 Post-traumatic bulbous urethral stricture: Secondary | ICD-10-CM | POA: Diagnosis not present

## 2015-02-26 DIAGNOSIS — R312 Other microscopic hematuria: Secondary | ICD-10-CM | POA: Insufficient documentation

## 2015-02-26 DIAGNOSIS — R311 Benign essential microscopic hematuria: Secondary | ICD-10-CM | POA: Diagnosis not present

## 2015-02-26 DIAGNOSIS — Z841 Family history of disorders of kidney and ureter: Secondary | ICD-10-CM | POA: Diagnosis not present

## 2015-02-26 DIAGNOSIS — Z720 Tobacco use: Secondary | ICD-10-CM | POA: Insufficient documentation

## 2015-02-26 DIAGNOSIS — Z7982 Long term (current) use of aspirin: Secondary | ICD-10-CM | POA: Diagnosis not present

## 2015-02-26 HISTORY — PX: CYSTOSCOPY W/ RETROGRADES: SHX1426

## 2015-02-26 LAB — GLUCOSE, CAPILLARY
GLUCOSE-CAPILLARY: 119 mg/dL — AB (ref 65–99)
Glucose-Capillary: 127 mg/dL — ABNORMAL HIGH (ref 65–99)

## 2015-02-26 LAB — BLADDER SCAN AMB NON-IMAGING

## 2015-02-26 SURGERY — CYSTOSCOPY, WITH RETROGRADE PYELOGRAM
Anesthesia: General | Wound class: Clean Contaminated

## 2015-02-26 MED ORDER — CEFAZOLIN SODIUM 1-5 GM-% IV SOLN
INTRAVENOUS | Status: AC
  Administered 2015-02-26: 1 g via INTRAVENOUS
  Filled 2015-02-26: qty 50

## 2015-02-26 MED ORDER — CEFAZOLIN SODIUM 1-5 GM-% IV SOLN
1.0000 g | Freq: Once | INTRAVENOUS | Status: AC
  Administered 2015-02-26 (×2): 1 g via INTRAVENOUS

## 2015-02-26 MED ORDER — IOTHALAMATE MEGLUMINE 43 % IV SOLN
INTRAVENOUS | Status: DC | PRN
  Administered 2015-02-26: 20 mL

## 2015-02-26 MED ORDER — FENTANYL CITRATE (PF) 100 MCG/2ML IJ SOLN
INTRAMUSCULAR | Status: DC | PRN
  Administered 2015-02-26: 50 ug via INTRAVENOUS

## 2015-02-26 MED ORDER — SODIUM CHLORIDE 0.9 % IV SOLN
INTRAVENOUS | Status: DC
  Administered 2015-02-26 (×2): via INTRAVENOUS

## 2015-02-26 MED ORDER — LIDOCAINE HCL 2 % EX GEL
1.0000 "application " | Freq: Once | CUTANEOUS | Status: AC
  Administered 2015-02-26: 1 via URETHRAL

## 2015-02-26 MED ORDER — ONDANSETRON HCL 4 MG/2ML IJ SOLN: 4.0000 mg | Freq: Once | INTRAMUSCULAR | Status: DC | PRN

## 2015-02-26 MED ORDER — LIDOCAINE HCL (CARDIAC) 20 MG/ML IV SOLN
INTRAVENOUS | Status: DC | PRN
  Administered 2015-02-26: 100 mg via INTRAVENOUS

## 2015-02-26 MED ORDER — GLYCOPYRROLATE 0.2 MG/ML IJ SOLN
INTRAMUSCULAR | Status: DC | PRN
  Administered 2015-02-26: 0.2 mg via INTRAVENOUS

## 2015-02-26 MED ORDER — FAMOTIDINE 20 MG PO TABS
ORAL_TABLET | ORAL | Status: AC
  Filled 2015-02-26: qty 1

## 2015-02-26 MED ORDER — FAMOTIDINE 20 MG PO TABS
20.0000 mg | ORAL_TABLET | Freq: Once | ORAL | Status: AC
  Administered 2015-02-26: 20 mg via ORAL

## 2015-02-26 MED ORDER — FENTANYL CITRATE (PF) 100 MCG/2ML IJ SOLN: 25.0000 ug | INTRAMUSCULAR | Status: DC | PRN

## 2015-02-26 MED ORDER — MIDAZOLAM HCL 2 MG/2ML IJ SOLN
INTRAMUSCULAR | Status: DC | PRN
  Administered 2015-02-26: 2 mg via INTRAVENOUS

## 2015-02-26 MED ORDER — PROPOFOL 10 MG/ML IV BOLUS
INTRAVENOUS | Status: DC | PRN
  Administered 2015-02-26: 200 mg via INTRAVENOUS

## 2015-02-26 MED ORDER — ONDANSETRON HCL 4 MG/2ML IJ SOLN
INTRAMUSCULAR | Status: DC | PRN
  Administered 2015-02-26: 4 mg via INTRAVENOUS

## 2015-02-26 SURGICAL SUPPLY — 26 items
BAG DRAIN CYSTO-URO LG1000N (MISCELLANEOUS) ×4 IMPLANT
CATH URETL 5X70 OPEN END (CATHETERS) ×4 IMPLANT
CONRAY 43 FOR UROLOGY 50M (MISCELLANEOUS) ×4 IMPLANT
CORD URO TURP 10FT (MISCELLANEOUS) ×4 IMPLANT
GLOVE BIO SURGEON STRL SZ 6.5 (GLOVE) ×3 IMPLANT
GLOVE BIO SURGEON STRL SZ7 (GLOVE) ×8 IMPLANT
GLOVE BIO SURGEONS STRL SZ 6.5 (GLOVE) ×1
GOWN STRL REUS W/ TWL LRG LVL3 (GOWN DISPOSABLE) ×4 IMPLANT
GOWN STRL REUS W/TWL LRG LVL3 (GOWN DISPOSABLE) ×4
JELLY LUB 2OZ STRL (MISCELLANEOUS) ×2
JELLY LUBE 2OZ STRL (MISCELLANEOUS) ×2 IMPLANT
KIT RM TURNOVER CYSTO AR (KITS) ×4 IMPLANT
PACK CYSTO AR (MISCELLANEOUS) ×4 IMPLANT
PAD GROUND ADULT SPLIT (MISCELLANEOUS) ×4 IMPLANT
PREP PVP WINGED SPONGE (MISCELLANEOUS) ×4 IMPLANT
PUMP SINGLE ACTION SAP (PUMP) ×4 IMPLANT
SENSORWIRE 0.038 NOT ANGLED (WIRE) ×8
SET CYSTO W/LG BORE CLAMP LF (SET/KITS/TRAYS/PACK) ×4 IMPLANT
SOL .9 NS 3000ML IRR  AL (IV SOLUTION) ×2
SOL .9 NS 3000ML IRR UROMATIC (IV SOLUTION) ×2 IMPLANT
STENT URET 6FRX24 CONTOUR (STENTS) IMPLANT
STENT URET 6FRX26 CONTOUR (STENTS) IMPLANT
SYRINGE IRR TOOMEY STRL 70CC (SYRINGE) ×4 IMPLANT
WATER STERILE IRR 1000ML POUR (IV SOLUTION) ×4 IMPLANT
WATER STERILE IRR 3000ML UROMA (IV SOLUTION) ×4 IMPLANT
WIRE SENSOR 0.038 NOT ANGLED (WIRE) ×4 IMPLANT

## 2015-02-26 NOTE — Anesthesia Procedure Notes (Signed)
Procedure Name: LMA Insertion Date/Time: 02/26/2015 7:37 AM Performed by: Silvana Newness Pre-anesthesia Checklist: Patient identified, Emergency Drugs available, Suction available, Patient being monitored and Timeout performed Patient Re-evaluated:Patient Re-evaluated prior to inductionOxygen Delivery Method: Circle system utilized Preoxygenation: Pre-oxygenation with 100% oxygen Intubation Type: IV induction Ventilation: Mask ventilation without difficulty LMA: LMA inserted LMA Size: 4.5 Number of attempts: 1 Placement Confirmation: positive ETCO2 and breath sounds checked- equal and bilateral Tube secured with: Tape Dental Injury: Teeth and Oropharynx as per pre-operative assessment

## 2015-02-26 NOTE — Discharge Instructions (Signed)
Cystoscopy, Care After Refer to this sheet in the next few weeks. These instructions provide you with information on caring for yourself after your procedure. Your caregiver may also give you more specific instructions. Your treatment has been planned according to current medical practices, but problems sometimes occur. Call your caregiver if you have any problems or questions after your procedure. HOME CARE INSTRUCTIONS  Things you can do to ease any discomfort after your procedure include:  Drinking enough water and fluids to keep your urine clear or pale yellow.  Taking a warm bath to relieve any burning feelings. SEEK IMMEDIATE MEDICAL CARE IF:   You have an increase in blood in your urine.  You notice blood clots in your urine.  You have difficulty passing urine.  You have the chills.  You have abdominal pain.  You have a fever or persistent symptoms for more than 2-3 days.  You have a fever and your symptoms suddenly get worse. MAKE SURE YOU:   Understand these instructions.  Will watch your condition.  Will get help right away if you are not doing well or get worse. Document Released: 01/31/2005 Document Revised: 03/16/2013 Document Reviewed: 01/05/2012 [facility] Patient Information 2015 [place], [place]. This information is not intended to replace advice given to you by your health care provider. Make sure you discuss any questions you have with your health care provider. AMBULATORY SURGERY  DISCHARGE INSTRUCTIONS   1) The drugs that you were given will stay in your system until tomorrow so for the next 24 hours you should not:  A) Drive an automobile B) Make any legal decisions C) Drink any alcoholic beverage   2) You may resume regular meals tomorrow.  Today it is better to start with liquids and gradually work up to solid foods.  You may eat anything you prefer, but it is better to start with liquids, then soup and crackers, and gradually work up to solid  foods.   3) Please notify your doctor immediately if you have any unusual bleeding, trouble breathing, redness and pain at the surgery site, drainage, fever, or pain not relieved by medication. 4)   5) Your post-operative visit with Dr.                                     is: Date:                        Time:    Please call to schedule your post-operative visit.  6) Additional Instructions: 7)

## 2015-02-26 NOTE — Anesthesia Preprocedure Evaluation (Signed)
Anesthesia Evaluation  Patient identified by MRN, date of birth, ID band Patient awake    Reviewed: Allergy & Precautions, NPO status , Patient's Chart, lab work & pertinent test results  History of Anesthesia Complications (+) history of anesthetic complications  Airway Mallampati: II  TM Distance: >3 FB Neck ROM: Full    Dental  (+) Partial Upper   Pulmonary former smoker (quit x 40 yrs, still chews),          Cardiovascular hypertension (pt denies),     Neuro/Psych    GI/Hepatic   Endo/Other  diabetes, Type 2, Oral Hypoglycemic Agents  Renal/GU      Musculoskeletal   Abdominal   Peds  Hematology   Anesthesia Other Findings   Reproductive/Obstetrics                             Anesthesia Physical Anesthesia Plan  ASA: III  Anesthesia Plan: General   Post-op Pain Management:    Induction: Intravenous  Airway Management Planned: LMA  Additional Equipment:   Intra-op Plan:   Post-operative Plan:   Informed Consent: I have reviewed the patients History and Physical, chart, labs and discussed the procedure including the risks, benefits and alternatives for the proposed anesthesia with the patient or authorized representative who has indicated his/her understanding and acceptance.     Plan Discussed with:   Anesthesia Plan Comments:         Anesthesia Quick Evaluation

## 2015-02-26 NOTE — Interval H&P Note (Signed)
History and Physical Interval Note:  02/26/2015 7:27 AM  Troy Rodriguez  has presented today for surgery, with the diagnosis of MICRO HEMATURIA  The various methods of treatment have been discussed with the patient and family. After consideration of risks, benefits and other options for treatment, the patient has consented to  Procedure(s): CYSTOSCOPY WITH RETROGRADE PYELOGRAM (Bilateral) CYSTOSCOPY WITH BIOPSY (N/A) CYSTOSCOPY WITH STENT PLACEMENT (Bilateral) as a surgical intervention .  The patient's history has been reviewed, patient examined, no change in status, stable for surgery.  I have reviewed the patient's chart and labs.  Questions were answered to the patient's satisfaction.    RRR CTAB   Hollice Espy

## 2015-02-26 NOTE — Op Note (Signed)
Date of procedure: 02/26/2015  Preoperative diagnosis:  1. Microscopic hematuria   Postoperative diagnosis:  1. Same as above   Procedure: 1. Cystoscopy 2. Bilateral retrograde pyelogram  Surgeon: Hollice Espy, MD  Anesthesia: General  Complications: None  Intraoperative findings: No bladder or ureteral abnormalities identified, slight bulbar stricture.  Mild phimosis.  EBL: Minimal  Specimens: None  Drains: None  Indication: Troy Rodriguez is a 74 y.o. patient with microscopic hematuria who underwent CT urogram with incomplete opacification of the ureters.  After reviewing the management options for treatment, he elected to proceed with the above surgical procedure(s). We have discussed the potential benefits and risks of the procedure, side effects of the proposed treatment, the likelihood of the patient achieving the goals of the procedure, and any potential problems that might occur during the procedure or recuperation. Informed consent has been obtained.  Description of procedure:  The patient was taken to the operating room and general anesthesia was induced.  The patient was placed in the dorsal lithotomy position, prepped and draped in the usual sterile fashion, and preoperative antibiotics were administered. A preoperative time-out was performed.   Prior to placing the scope, mild phimosis in slightly inflamed foreskin was noted. His was able to be mostly retracted for the purpose of the procedure. A rigid 80 French cystoscope was advanced per urethra into the bladder. Of note he had some mild bulbar urethral narrowing which was able to be passed with the scope. This mucosa was somewhat friable and bled with manipulation/passage of the scope. The prostate was relatively short with bilobar coaptation. The bladder neck was slightly elevated.  The bladder was then carefully inspected which had normal-appearing mucosa, mildly trabeculated without masses, lesions, or  ulcerations.   There were no stones or tumors identified. The trigone was normal with clear reflux of urine from each UO. Attention was then turned to the right ureteral orifice which was cannulated using a sensor wire and a 5 [place] open-ended ureteral catheter. Contrast was then injected through the 5 [place] cathetered just within the UO to complete a retrograde pyelogram. This revealed a very delicate appearing ureter without any filling defects. The upper tract collecting system was normal and decompressed without any filling defects. The same exact procedure was performed on the left side again revealing a delicate ureter and normal upper tract collecting system.  The bladder was then drained and the scope was removed. The patient was repositioned in the supine position, reversed from anesthesia, taken to the PACU in stable condition. There were no complications in this case.  Hollice Espy, M.D.

## 2015-02-26 NOTE — Anesthesia Postprocedure Evaluation (Signed)
  Anesthesia Post-op Note  Patient: Troy Rodriguez  Procedure(s) Performed: Procedure(s): CYSTOSCOPY WITH RETROGRADE PYELOGRAM (Bilateral)  Anesthesia type:General  Patient location: PACU  Post pain: Pain level controlled  Post assessment: Post-op Vital signs reviewed, Patient's Cardiovascular Status Stable, Respiratory Function Stable, Patent Airway and No signs of Nausea or vomiting  Post vital signs: Reviewed and stable  Last Vitals:  Filed Vitals:   02/26/15 0714  BP: 134/54  Temp: 36.9 C  Resp: 16    Level of consciousness: awake, alert  and patient cooperative  Complications: No apparent anesthesia complications

## 2015-02-26 NOTE — Transfer of Care (Signed)
Immediate Anesthesia Transfer of Care Note  Patient: Troy Rodriguez  Procedure(s) Performed: Procedure(s): CYSTOSCOPY WITH RETROGRADE PYELOGRAM (Bilateral)  Patient Location: PACU  Anesthesia Type:General  Level of Consciousness: awake, alert , oriented and patient cooperative  Airway & Oxygen Therapy: Patient Spontanous Breathing and Patient connected to face mask oxygen  Post-op Assessment: Report given to RN, Post -op Vital signs reviewed and stable and Patient moving all extremities X 4  Post vital signs: Reviewed and stable  Last Vitals:  Pulse: 50 BP:134/68 97% Respirations: 13  Complications: No apparent anesthesia complications

## 2015-02-26 NOTE — H&P (View-Only) (Signed)
4:55 PM   CHALES Rodriguez 12/10/1940 [id number]  Referring provider: Juline Patch, MD [address] [place], [place] [address]  Chief Complaint  Patient presents with  . Follow-up    CT urogram for hematuria     HPI: Mr. Troy Rodriguez is a 74 year old white male who has completed his CT Urogram for the first part of his work up for microscopic hematuria. He has not experienced any gross hematuria.  He is on Actos for his diabetes and has been for ten years.    CT Urogram is reviewed with the patient and his wife.  The ureters were not found to be completely opacified on today's exam.  His baseline line urinary symptoms are infrequent nocturia and infrequent intermittency.  He has not had a history of UTI's, stones or GU malignancies.  He does not have a FM Hx of stones, but his brother was diagnosed with PCa in his 51's.  Patient's most recent PSA from his PCP's office was 1.6 ng/mL on 05/22/2014.    He is currently not experiencing any flank pain, fevers, chills, nausea, vomiting or dysuria.  He is still having microscopic hematuria on today's UA.   PSA History:    1.6 ng/mL on 05/22/2014  PMH: Past Medical History  Diagnosis Date  . Arthritis   . Diabetes   . HLD (hyperlipidemia)   . Umbilical hernia   . Hematuria   . Hypertension     Surgical History: Past Surgical History  Procedure Laterality Date  . Cataract extraction Bilateral 2003  . Hernia repair  2015    Home Medications:    Medication List       This list is accurate as of: 02/20/15  4:55 PM.  Always use your most recent med list.               aspirin 81 MG tablet  Take 1 tablet by mouth daily.     atorvastatin 40 MG tablet  Commonly known as:  LIPITOR  Take 1 tablet by mouth daily.     pioglitazone 30 MG tablet  Commonly known as:  ACTOS  Take 1 tablet by mouth daily.     VYTORIN 10-40 MG per tablet  Generic drug:  ezetimibe-simvastatin  TAKE 1 TABLET BY MOUTH DAILY         Allergies: No Known Allergies  Family History: Family History  Problem Relation Age of Onset  . Prostate cancer Brother   . Kidney disease Neg Hx     Social History:  reports that he has quit smoking. His smokeless tobacco use includes Chew. He reports that he does not drink alcohol or use illicit drugs.  ROS: Urological Symptom Review  Patient is experiencing the following symptoms: Get up at night to urinate Trouble starting stream   Review of Systems  Gastrointestinal (upper)  : Negative for upper GI symptoms  Gastrointestinal (lower) : Negative for lower GI symptoms  Constitutional : Negative for symptoms  Skin: Negative for skin symptoms  Eyes: Negative for eye symptoms  Ear/Nose/Throat : Negative for Ear/Nose/Throat symptoms  Hematologic/Lymphatic: Easy bruising  Cardiovascular : Negative for cardiovascular symptoms  Respiratory : Negative for respiratory symptoms  Endocrine: Negative for endocrine symptoms  Musculoskeletal: Joint pain  Neurological: Negative for neurological symptoms  Psychologic: Negative for psychiatric symptoms   Physical Exam: BP 127/71 mmHg  Pulse 65  Temp(Src) 97.6 F (36.4 C) (Oral)  Ht 6\' 1"  (1.854 m)  Constitutional:  Alert and oriented, No acute distress. HEENT: [place] AT, moist mucus membranes.  Trachea midline, no masses. Cardiovascular: No clubbing, cyanosis, or edema. Respiratory: Normal respiratory effort, no increased work of breathing. GI: Abdomen is soft, nontender, nondistended, no abdominal masses GU: No CVA tenderness.  Skin: No rashes, bruises or suspicious lesions. Lymph: No cervical or inguinal adenopathy. Neurologic: Grossly intact, no focal deficits, moving all 4 extremities. Psychiatric: Normal mood and affect.  Laboratory Data: Results for orders placed or performed in visit on 02/20/15  Microscopic Examination  Result Value Ref Range   WBC, UA 0-5 0 -  5 /hpf   RBC, UA 3-10 (A) 0  -  2 /hpf   Epithelial Cells (non renal) 0-10 0 - 10 /hpf   Bacteria, UA None seen None seen/Few  Urinalysis, Complete  Result Value Ref Range   Specific Gravity, UA 1.025 1.005 - 1.030   pH, UA 5.0 5.0 - 7.5   Color, UA Yellow Yellow   Appearance Ur Clear Clear   Leukocytes, UA Negative Negative   Protein, UA Negative Negative/Trace   Glucose, UA Negative Negative   Ketones, UA Trace (A) Negative   RBC, UA 1+ (A) Negative   Bilirubin, UA Negative Negative   Urobilinogen, Ur 0.2 0.2 - 1.0 mg/dL   Nitrite, UA Negative Negative   Microscopic Examination See below:     Results for orders placed or performed in visit on 02/20/15  Microscopic Examination  Result Value Ref Range   WBC, UA 0-5 0 -  5 /hpf   RBC, UA 3-10 (A) 0 -  2 /hpf   Epithelial Cells (non renal) 0-10 0 - 10 /hpf   Bacteria, UA None seen None seen/Few  Urinalysis, Complete  Result Value Ref Range   Specific Gravity, UA 1.025 1.005 - 1.030   pH, UA 5.0 5.0 - 7.5   Color, UA Yellow Yellow   Appearance Ur Clear Clear   Leukocytes, UA Negative Negative   Protein, UA Negative Negative/Trace   Glucose, UA Negative Negative   Ketones, UA Trace (A) Negative   RBC, UA 1+ (A) Negative   Bilirubin, UA Negative Negative   Urobilinogen, Ur 0.2 0.2 - 1.0 mg/dL   Nitrite, UA Negative Negative   Microscopic Examination See below:    No results found for: WBC, HGB, HCT, MCV, PLT  Lab Results  Component Value Date   CREATININE 1.2 11/13/2014    Lab Results  Component Value Date   PSA 1.6 05/22/2014    No results found for: TESTOSTERONE  Lab Results  Component Value Date   HGBA1C 6.1* 11/13/2014    Urinalysis    Component Value Date/Time   GLUCOSEU Negative 02/20/2015 1406   BILIRUBINUR Negative 02/20/2015 1406   NITRITE Negative 02/20/2015 1406   LEUKOCYTESUR Negative 02/20/2015 1406    Pertinent Imaging:  Assessment & Plan:    1. Microscopic hematuria:  Patient has completed his CT Urogram and no  etiology is found for his hematuria, but his ureters did not completely opacify on the exam.  He is at risk for bladder cancer with his long term use of Actos for his diabetes.    I discussed with the patient that a cystoscopy with RTG's would be the next step in his work up.    I described to the patient how the procedure is performed and the risk associated with the procedure, such as: Infection, bleeding, uncomfortableness for the first few days after the procedure, the possibility of a  biopsy of an area of concern in the ureters or bladder and the possibility of a stent placement.  He voiced his understanding of this procedure and his questions were answered.  He is instructed to stop his ASA at this time.    - Urinalysis, Complete - CULTURE, URINE COMPREHENSIVE   Return for cystoscopy with RTG's .  Zara Council, [facility] [address] [place], [place] [address] [phone]

## 2015-02-26 NOTE — Interval H&P Note (Signed)
History and Physical Interval Note:  02/26/2015 7:21 AM  Troy Rodriguez  has presented today for surgery, with the diagnosis of MICRO HEMATURIA  The various methods of treatment have been discussed with the patient and family. After consideration of risks, benefits and other options for treatment, the patient has consented to  Procedure(s): CYSTOSCOPY WITH RETROGRADE PYELOGRAM (Bilateral) CYSTOSCOPY WITH BIOPSY (N/A) CYSTOSCOPY WITH STENT PLACEMENT (Bilateral) as a surgical intervention .  The patient's history has been reviewed, patient examined, no change in status, stable for surgery.  I have reviewed the patient's chart and labs.  Questions were answered to the patient's satisfaction.     Hollice Espy

## 2015-02-26 NOTE — Progress Notes (Signed)
Patient had surgery today with Dr. Erlene Quan, Cysto Bilateral Retogrades.  He called this afternoon stating that he had not urinated since surgery and was in some discomfort. Patient was instructed to come to the office for a PVR. Patient presents at the office in supra pubic discomfort stating he still cannot void, his bladder scan was 872ml. After speaking with Zara Council, PA I was instructed to place a foley catheter.   Simple Catheter Placement  Due to urinary retention patient is present today for a foley cath placement.  Patient was cleaned and prepped in a sterile fashion with betadine and lidocaine jelly 2% was instilled into the urethra.  A 16 coude FR foley catheter was inserted, urine return was noted  948ml, urine was dark reddish brown in color.  The balloon was filled with 10cc of sterile water.  A leg bag was attached for drainage. Patient was also given a night bag to take home and was given instruction on how to change from one bag to another.  Patient was given instruction on proper catheter care.  Patient tolerated well, no complications were noted   Preformed by: Fonnie Jarvis, CMA  Will call patient for follow up

## 2015-02-28 ENCOUNTER — Ambulatory Visit: Payer: Medicare Other

## 2015-02-28 DIAGNOSIS — R339 Retention of urine, unspecified: Secondary | ICD-10-CM

## 2015-02-28 NOTE — Progress Notes (Signed)
Fill and Pull Catheter Removal  Patient is present today for a catheter removal.  Patient was cleaned and prepped in a sterile fashion 152ml of sterile water/ saline was instilled into the bladder when the patient felt the urge to urinate. 95ml of water was then drained from the balloon.  A 16FR coude foley cath was removed from the bladder no complications were noted .  Patient was then given some time to void on their own.  Patient can void  172ml on their own after some time.  Patient tolerated well.  Preformed by: Toniann Fail, LPN

## 2015-03-02 ENCOUNTER — Other Ambulatory Visit: Payer: Medicare Other

## 2015-03-02 DIAGNOSIS — N390 Urinary tract infection, site not specified: Secondary | ICD-10-CM | POA: Diagnosis not present

## 2015-03-05 LAB — CULTURE, URINE COMPREHENSIVE

## 2015-03-06 ENCOUNTER — Telehealth: Payer: Self-pay | Admitting: Urology

## 2015-03-06 MED ORDER — SULFAMETHOXAZOLE-TRIMETHOPRIM 800-160 MG PO TABS: 1.0000 | ORAL_TABLET | Freq: Two times a day (BID) | ORAL | Status: DC

## 2015-03-06 NOTE — Telephone Encounter (Signed)
Spoke with pt who stated he was having burning on urination with back pain. Made him aware of medication at pharmacy. Pt voiced understanding.

## 2015-03-06 NOTE — Telephone Encounter (Signed)
Please see positive urine culture result for Mr. Troy Rodriguez. I assumed that he was having symptoms and therefore dropped off a urine, can you please clarify?  He should be treated with Bactrim DS twice a day 7 days if he is symptomatic.  Order placed.  Hollice Espy, MD

## 2015-04-30 ENCOUNTER — Other Ambulatory Visit: Payer: Self-pay | Admitting: Family Medicine

## 2015-04-30 DIAGNOSIS — E785 Hyperlipidemia, unspecified: Secondary | ICD-10-CM

## 2015-06-30 ENCOUNTER — Other Ambulatory Visit: Payer: Self-pay | Admitting: Family Medicine

## 2015-07-02 ENCOUNTER — Other Ambulatory Visit: Payer: Self-pay

## 2015-08-06 ENCOUNTER — Ambulatory Visit: Payer: Medicare Other | Admitting: Family Medicine

## 2015-08-08 ENCOUNTER — Encounter: Payer: Self-pay | Admitting: Family Medicine

## 2015-08-08 ENCOUNTER — Ambulatory Visit (INDEPENDENT_AMBULATORY_CARE_PROVIDER_SITE_OTHER): Payer: PPO | Admitting: Family Medicine

## 2015-08-08 VITALS — BP 130/60 | HR 60 | Ht 73.0 in | Wt 294.0 lb

## 2015-08-08 DIAGNOSIS — E119 Type 2 diabetes mellitus without complications: Secondary | ICD-10-CM

## 2015-08-08 DIAGNOSIS — E785 Hyperlipidemia, unspecified: Secondary | ICD-10-CM

## 2015-08-08 DIAGNOSIS — E784 Other hyperlipidemia: Secondary | ICD-10-CM | POA: Diagnosis not present

## 2015-08-08 DIAGNOSIS — E7849 Other hyperlipidemia: Secondary | ICD-10-CM

## 2015-08-08 MED ORDER — LIRAGLUTIDE 18 MG/3ML ~~LOC~~ SOPN: 0.6000 mL | PEN_INJECTOR | Freq: Every day | SUBCUTANEOUS | Status: DC

## 2015-08-08 MED ORDER — ATORVASTATIN CALCIUM 40 MG PO TABS: 40.0000 mg | ORAL_TABLET | Freq: Every day | ORAL | Status: DC

## 2015-08-08 NOTE — Progress Notes (Signed)
Name: Troy Rodriguez   MRN: [id number]    DOB: 11/18/1940   Date:08/08/2015       Progress Note  Subjective  Chief Complaint  Chief Complaint  Patient presents with  . Hyperlipidemia  . Diabetes    Hyperlipidemia This is a chronic problem. The current episode started more than 1 year ago. The problem is controlled. Recent lipid tests were reviewed and are low. Exacerbating diseases include obesity. He has no history of chronic renal disease, diabetes, hypothyroidism, liver disease or nephrotic syndrome. There are no known factors aggravating his hyperlipidemia. Pertinent negatives include no chest pain, focal sensory loss, focal weakness, leg pain, myalgias or shortness of breath. Current antihyperlipidemic treatment includes statins. The current treatment provides mild improvement of lipids. There are no compliance problems.  Risk factors for coronary artery disease include diabetes mellitus and dyslipidemia.  Diabetes He presents for his follow-up diabetic visit. He has type 2 diabetes mellitus. His disease course has been stable. There are no hypoglycemic associated symptoms. Pertinent negatives for hypoglycemia include no dizziness, headaches or nervousness/anxiousness. Pertinent negatives for diabetes include no blurred vision, no chest pain, no fatigue, no foot paresthesias, no foot ulcerations, no polydipsia, no polyphagia, no polyuria, no visual change, no weakness and no weight loss. There are no hypoglycemic complications. Symptoms are stable. Pertinent negatives for diabetic complications include no autonomic neuropathy, CVA, heart disease, impotence, nephropathy, peripheral neuropathy, PVD or retinopathy. Risk factors for coronary artery disease include diabetes mellitus and dyslipidemia. Current diabetic treatment includes oral agent (monotherapy). He is compliant with treatment all of the time. His weight is stable. He is following a generally healthy diet. He has not had a previous visit  with a dietitian. He participates in exercise intermittently. His breakfast blood glucose is taken between 8-9 am. His breakfast blood glucose range is generally 110-130 mg/dl. An ACE inhibitor/angiotensin II receptor blocker is not being taken. He does not see a podiatrist.Eye exam is current.    No problem-specific assessment & plan notes found for this encounter.   Past Medical History  Diagnosis Date  . Arthritis   . Diabetes ([place])   . HLD (hyperlipidemia)   . Umbilical hernia   . Hematuria     Past Surgical History  Procedure Laterality Date  . Cataract extraction Bilateral 2003  . Hernia repair  2015  . Cystoscopy w/ retrogrades Bilateral 02/26/2015    Procedure: CYSTOSCOPY WITH RETROGRADE PYELOGRAM;  Surgeon: Hollice Espy, MD;  Location: [facility] ORS;  Service: Urology;  Laterality: Bilateral;    Family History  Problem Relation Age of Onset  . Prostate cancer Brother   . Kidney disease Neg Hx     Social History   Social History  . Marital Status: Married    Spouse Name: N/A  . Number of Children: N/A  . Years of Education: N/A   Occupational History  . Not on file.   Social History Main Topics  . Smoking status: Former Smoker    Types: Cigarettes    Quit date: 02/23/1975  . Smokeless tobacco: Current User    Types: Chew     Comment: smoked for only 6 mos out of his whole life  . Alcohol Use: No     Comment: no drinking in the past 3 months  . Drug Use: No  . Sexual Activity: Not Currently   Other Topics Concern  . Not on file   Social History Narrative   Caffeine use:2 per day.    No Known  Allergies   Review of Systems  Constitutional: Negative for fever, chills, weight loss, malaise/fatigue and fatigue.  HENT: Negative for ear discharge, ear pain and sore throat.   Eyes: Negative for blurred vision.  Respiratory: Negative for cough, sputum production, shortness of breath and wheezing.   Cardiovascular: Negative for chest pain, palpitations and  leg swelling.  Gastrointestinal: Negative for heartburn, nausea, abdominal pain, diarrhea, constipation, blood in stool and melena.  Genitourinary: Negative for dysuria, urgency, frequency, hematuria and impotence.  Musculoskeletal: Negative for myalgias, back pain, joint pain and neck pain.  Skin: Negative for rash.  Neurological: Negative for dizziness, tingling, sensory change, focal weakness, weakness and headaches.  Endo/Heme/Allergies: Negative for environmental allergies, polydipsia and polyphagia. Does not bruise/bleed easily.  Psychiatric/Behavioral: Negative for depression and suicidal ideas. The patient is not nervous/anxious and does not have insomnia.      Objective  Filed Vitals:   08/08/15 0806  BP: 130/60  Pulse: 60  Height: 6\' 1"  (1.854 m)  Weight: 294 lb (133.358 kg)    Physical Exam  Constitutional: He is oriented to person, place, and time and well-developed, well-nourished, and in no distress.  HENT:  Head: Normocephalic.  Right Ear: External ear normal.  Left Ear: External ear normal.  Nose: Nose normal.  Mouth/Throat: Oropharynx is clear and moist.  Eyes: Conjunctivae and EOM are normal. Pupils are equal, round, and reactive to light. Right eye exhibits no discharge. Left eye exhibits no discharge. No scleral icterus.  Neck: Normal range of motion. Neck supple. No JVD present. No tracheal deviation present. No thyromegaly present.  Cardiovascular: Normal rate, regular rhythm, normal heart sounds and intact distal pulses.  Exam reveals no gallop and no friction rub.   No murmur heard. Pulmonary/Chest: Breath sounds normal. No respiratory distress. He has no wheezes. He has no rales.  Abdominal: Soft. Bowel sounds are normal. He exhibits no mass. There is no hepatosplenomegaly. There is no tenderness. There is no rebound, no guarding and no CVA tenderness.  Musculoskeletal: Normal range of motion. He exhibits no edema or tenderness.  Lymphadenopathy:    He  has no cervical adenopathy.  Neurological: He is alert and oriented to person, place, and time. He has normal sensation, normal strength, normal reflexes and intact cranial nerves. No cranial nerve deficit.  Skin: Skin is warm. No rash noted.  Psychiatric: Mood and affect normal.  Nursing note and vitals reviewed.     Assessment & Plan  Problem List Items Addressed This Visit      Endocrine   Diabetes mellitus, type 2 ([place]) - Primary   Relevant Medications   atorvastatin (LIPITOR) 40 MG tablet   Other Relevant Orders   Renal Function Panel     Other   Familial multiple lipoprotein-type hyperlipidemia   Relevant Medications   atorvastatin (LIPITOR) 40 MG tablet   Other Relevant Orders   Hepatic function panel    Other Visit Diagnoses    Hyperlipidemia        Relevant Medications    atorvastatin (LIPITOR) 40 MG tablet    Other Relevant Orders    Renal Function Panel    Lipid Profile    Hepatic function panel         Dr. Otilio Miu [facility]  08/08/2015

## 2015-08-09 LAB — RENAL FUNCTION PANEL
Albumin: 4.1 g/dL (ref 3.5–4.8)
BUN / CREAT RATIO: 13 (ref 10–22)
BUN: 16 mg/dL (ref 8–27)
CO2: 26 mmol/L (ref 18–29)
CREATININE: 1.25 mg/dL (ref 0.76–1.27)
Calcium: 9.4 mg/dL (ref 8.6–10.2)
Chloride: 99 mmol/L (ref 96–106)
GFR, EST AFRICAN AMERICAN: 64 mL/min/{1.73_m2} (ref >59)
GFR, EST NON AFRICAN AMERICAN: 56 mL/min/{1.73_m2} — AB (ref >59)
Glucose: 127 mg/dL — ABNORMAL HIGH (ref 65–99)
Phosphorus: 2.8 mg/dL (ref 2.5–4.5)
Potassium: 4.8 mmol/L (ref 3.5–5.2)
SODIUM: 141 mmol/L (ref 134–144)

## 2015-08-09 LAB — LIPID PANEL
CHOL/HDL RATIO: 5.7 ratio — AB (ref 0.0–5.0)
Cholesterol, Total: 166 mg/dL (ref 100–199)
HDL: 29 mg/dL — ABNORMAL LOW (ref >39)
LDL CALC: 104 mg/dL — AB (ref 0–99)
Triglycerides: 164 mg/dL — ABNORMAL HIGH (ref 0–149)
VLDL CHOLESTEROL CAL: 33 mg/dL (ref 5–40)

## 2015-08-09 LAB — HEPATIC FUNCTION PANEL
ALT: 29 IU/L (ref 0–44)
AST: 32 IU/L (ref 0–40)
Alkaline Phosphatase: 104 IU/L (ref 39–117)
BILIRUBIN TOTAL: 1.1 mg/dL (ref 0.0–1.2)
BILIRUBIN, DIRECT: 0.23 mg/dL (ref 0.00–0.40)
TOTAL PROTEIN: 6.7 g/dL (ref 6.0–8.5)

## 2015-08-14 ENCOUNTER — Other Ambulatory Visit: Payer: Self-pay | Admitting: Family Medicine

## 2015-08-27 ENCOUNTER — Ambulatory Visit (INDEPENDENT_AMBULATORY_CARE_PROVIDER_SITE_OTHER): Payer: PPO | Admitting: Family Medicine

## 2015-08-27 ENCOUNTER — Encounter: Payer: Self-pay | Admitting: Family Medicine

## 2015-08-27 VITALS — BP 136/76 | HR 64 | Ht 73.0 in | Wt 292.0 lb

## 2015-08-27 DIAGNOSIS — E119 Type 2 diabetes mellitus without complications: Secondary | ICD-10-CM | POA: Diagnosis not present

## 2015-08-27 DIAGNOSIS — Z23 Encounter for immunization: Secondary | ICD-10-CM | POA: Diagnosis not present

## 2015-08-27 NOTE — Patient Instructions (Signed)
Exercising to Lose Weight Exercising can help you to lose weight. In order to lose weight through exercise, you need to do vigorous-intensity exercise. You can tell that you are exercising with vigorous intensity if you are breathing very hard and fast and cannot hold a conversation while exercising. Moderate-intensity exercise helps to maintain your current weight. You can tell that you are exercising at a moderate level if you have a higher heart rate and faster breathing, but you are still able to hold a conversation. HOW OFTEN SHOULD I EXERCISE? Choose an activity that you enjoy and set realistic goals. Your health care provider can help you to make an activity plan that works for you. Exercise regularly as directed by your health care provider. This may include:  Doing resistance training twice each week, such as:  Push-ups.  Sit-ups.  Lifting weights.  Using resistance bands.  Doing a given intensity of exercise for a given amount of time. Choose from these options:  150 minutes of moderate-intensity exercise every week.  75 minutes of vigorous-intensity exercise every week.  A mix of moderate-intensity and vigorous-intensity exercise every week. Children, pregnant women, people who are out of shape, people who are overweight, and older adults may need to consult a health care provider for individual recommendations. If you have any sort of medical condition, be sure to consult your health care provider before starting a new exercise program. WHAT ARE SOME ACTIVITIES THAT CAN HELP ME TO LOSE WEIGHT?   Walking at a rate of at least 4.5 miles an hour.  Jogging or running at a rate of 5 miles per hour.  Biking at a rate of at least 10 miles per hour.  Lap swimming.  Roller-skating or in-line skating.  Cross-country skiing.  Vigorous competitive sports, such as football, basketball, and soccer.  Jumping rope.  Aerobic dancing. HOW CAN I BE MORE ACTIVE IN MY DAY-TO-DAY  ACTIVITIES?  Use the stairs instead of the elevator.  Take a walk during your lunch break.  If you drive, park your car farther away from work or school.  If you take public transportation, get off one stop early and walk the rest of the way.  Make all of your phone calls while standing up and walking around.  Get up, stretch, and walk around every 30 minutes throughout the day. WHAT GUIDELINES SHOULD I FOLLOW WHILE EXERCISING?  Do not exercise so much that you hurt yourself, feel dizzy, or get very short of breath.  Consult your health care provider prior to starting a new exercise program.  Wear comfortable clothes and shoes with good support.  Drink plenty of water while you exercise to prevent dehydration or heat stroke. Body water is lost during exercise and must be replaced.  Work out until you breathe faster and your heart beats faster.   This information is not intended to replace advice given to you by your health care provider. Make sure you discuss any questions you have with your health care provider.   Document Released: 08/16/2010 Document Revised: 08/04/2014 Document Reviewed: 12/15/2013 Elsevier Interactive Patient Education 2016 [employer] Calorie Counting for Weight Loss Calories are energy you get from the things you eat and drink. Your body uses this energy to keep you going throughout the day. The number of calories you eat affects your weight. When you eat more calories than your body needs, your body stores the extra calories as fat. When you eat fewer calories than your body needs, your body burns   fat to get the energy it needs. Calorie counting means keeping track of how many calories you eat and drink each day. If you make sure to eat fewer calories than your body needs, you should lose weight. In order for calorie counting to work, you will need to eat the number of calories that are right for you in a day to lose a healthy amount of weight per week. A  healthy amount of weight to lose per week is usually 1-2 lb (0.5-0.9 kg). A dietitian can determine how many calories you need in a day and give you suggestions on how to reach your calorie goal.  WHAT IS MY MY PLAN? My goal is to have __________ calories per day.  If I have this many calories per day, I should lose around __________ pounds per week. WHAT DO I NEED TO KNOW ABOUT CALORIE COUNTING? In order to meet your daily calorie goal, you will need to:  Find out how many calories are in each food you would like to eat. Try to do this before you eat.  Decide how much of the food you can eat.  Write down what you ate and how many calories it had. Doing this is called keeping a food log. WHERE DO I FIND CALORIE INFORMATION? The number of calories in a food can be found on a Nutrition Facts label. Note that all the information on a label is based on a specific serving of the food. If a food does not have a Nutrition Facts label, try to look up the calories online or ask your dietitian for help. HOW DO I DECIDE HOW MUCH TO EAT? To decide how much of the food you can eat, you will need to consider both the number of calories in one serving and the size of one serving. This information can be found on the Nutrition Facts label. If a food does not have a Nutrition Facts label, look up the information online or ask your dietitian for help. Remember that calories are listed per serving. If you choose to have more than one serving of a food, you will have to multiply the calories per serving by the amount of servings you plan to eat. For example, the label on a package of bread might say that a serving size is 1 slice and that there are 90 calories in a serving. If you eat 1 slice, you will have eaten 90 calories. If you eat 2 slices, you will have eaten 180 calories. HOW DO I KEEP A FOOD LOG? After each meal, record the following information in your food log:  What you ate.  How much of it you  ate.  How many calories it had.  Then, add up your calories. Keep your food log near you, such as in a small notebook in your pocket. Another option is to use a mobile app or website. Some programs will calculate calories for you and show you how many calories you have left each time you add an item to the log. WHAT ARE SOME CALORIE COUNTING TIPS?  Use your calories on foods and drinks that will fill you up and not leave you hungry. Some examples of this include foods like nuts and nut butters, vegetables, lean proteins, and high-fiber foods (more than 5 g fiber per serving).  Eat nutritious foods and avoid empty calories. Empty calories are calories you get from foods or beverages that do not have many nutrients, such as candy and soda. It   is better to have a nutritious high-calorie food (such as an avocado) than a food with few nutrients (such as a bag of chips).  Know how many calories are in the foods you eat most often. This way, you do not have to look up how many calories they have each time you eat them.  Look out for foods that may seem like low-calorie foods but are really high-calorie foods, such as baked goods, soda, and fat-free candy.  Pay attention to calories in drinks. Drinks such as sodas, specialty coffee drinks, alcohol, and juices have a lot of calories yet do not fill you up. Choose low-calorie drinks like water and diet drinks.  Focus your calorie counting efforts on higher calorie items. Logging the calories in a garden salad that contains only vegetables is less important than calculating the calories in a milk shake.  Find a way of tracking calories that works for you. Get creative. Most people who are successful find ways to keep track of how much they eat in a day, even if they do not count every calorie. WHAT ARE SOME PORTION CONTROL TIPS?  Know how many calories are in a serving. This will help you know how many servings of a certain food you can have.  Use a  measuring cup to measure serving sizes. This is helpful when you start out. With time, you will be able to estimate serving sizes for some foods.  Take some time to put servings of different foods on your favorite plates, bowls, and cups so you know what a serving looks like.  Try not to eat straight from a bag or box. Doing this can lead to overeating. Put the amount you would like to eat in a cup or on a plate to make sure you are eating the right portion.  Use smaller plates, glasses, and bowls to prevent overeating. This is a quick and easy way to practice portion control. If your plate is smaller, less food can fit on it.  Try not to multitask while eating, such as watching TV or using your computer. If it is time to eat, sit down at a table and enjoy your food. Doing this will help you to start recognizing when you are full. It will also make you more aware of what and how much you are eating. HOW CAN I CALORIE COUNT WHEN EATING OUT?  Ask for smaller portion sizes or child-sized portions.  Consider sharing an entree and sides instead of getting your own entree.  If you get your own entree, eat only half. Ask for a box at the beginning of your meal and put the rest of your entree in it so you are not tempted to eat it.  Look for the calories on the menu. If calories are listed, choose the lower calorie options.  Choose dishes that include vegetables, fruits, whole grains, low-fat dairy products, and lean protein. Focusing on smart food choices from each of the 5 food groups can help you stay on track at restaurants.  Choose items that are boiled, broiled, grilled, or steamed.  Choose water, milk, unsweetened iced tea, or other drinks without added sugars. If you want an alcoholic beverage, choose a lower calorie option. For example, a regular margarita can have up to 700 calories and a glass of wine has around 150.  Stay away from items that are buttered, battered, fried, or served with  cream sauce. Items labeled "crispy" are usually fried, unless stated otherwise.    Ask for dressings, sauces, and syrups on the side. These are usually very high in calories, so do not eat much of them.  Watch out for salads. Many people think salads are a healthy option, but this is often not the case. Many salads come with bacon, fried chicken, lots of cheese, fried chips, and dressing. All of these items have a lot of calories. If you want a salad, choose a garden salad and ask for grilled meats or steak. Ask for the dressing on the side, or ask for olive oil and vinegar or lemon to use as dressing.  Estimate how many servings of a food you are given. For example, a serving of cooked rice is  cup or about the size of half a tennis ball or one cupcake wrapper. Knowing serving sizes will help you be aware of how much food you are eating at restaurants. The list below tells you how big or small some common portion sizes are based on everyday objects.  1 oz--4 stacked dice.  3 oz--1 deck of cards.  1 tsp--1 dice.  1 Tbsp-- a Ping-Pong ball.  2 Tbsp--1 Ping-Pong ball.   cup--1 tennis ball or 1 cupcake wrapper.  1 cup--1 baseball.   This information is not intended to replace advice given to you by your health care provider. Make sure you discuss any questions you have with your health care provider.   Document Released: 07/14/2005 Document Revised: 08/04/2014 Document Reviewed: 05/19/2013 Elsevier Interactive Patient Education 2016 Elsevier Inc.  

## 2015-08-27 NOTE — Progress Notes (Signed)
Name: Troy Rodriguez   MRN: [id number]    DOB: Oct 14, 1940   Date:08/27/2015       Progress Note  Subjective  Chief Complaint  Chief Complaint  Patient presents with  . Diabetes    took off of Actos- follow up  . Immunizations    get TDAP    Diabetes He presents for his follow-up diabetic visit. He has type 2 diabetes mellitus. His disease course has been stable. Pertinent negatives for hypoglycemia include no confusion, dizziness, headaches, hunger, mood changes, nervousness/anxiousness, pallor, seizures, sleepiness, speech difficulty, sweats or tremors. Pertinent negatives for diabetes include no blurred vision, no chest pain, no fatigue, no foot paresthesias, no foot ulcerations, no polydipsia, no polyphagia, no polyuria, no visual change, no weakness and no weight loss. There are no hypoglycemic complications. Symptoms are stable. There are no known risk factors for coronary artery disease. Current diabetic treatment includes diet. His weight is stable. He is following a generally healthy diet. He has not had a previous visit with a dietitian. He participates in exercise intermittently. His breakfast blood glucose is taken between 8-9 am. His breakfast blood glucose range is generally 110-130 mg/dl. He does not see a podiatrist.Eye exam is current (3/17).    No problem-specific assessment & plan notes found for this encounter.   Past Medical History  Diagnosis Date  . Arthritis   . Diabetes ([place])   . HLD (hyperlipidemia)   . Umbilical hernia   . Hematuria     Past Surgical History  Procedure Laterality Date  . Cataract extraction Bilateral 2003  . Hernia repair  2015  . Cystoscopy w/ retrogrades Bilateral 02/26/2015    Procedure: CYSTOSCOPY WITH RETROGRADE PYELOGRAM;  Surgeon: Hollice Espy, MD;  Location: [facility];  Service: Urology;  Laterality: Bilateral;    Family History  Problem Relation Age of Onset  . Prostate cancer Brother   . Kidney disease Neg Hx     Social  History   Social History  . Marital Status: Married    Spouse Name: N/A  . Number of Children: N/A  . Years of Education: N/A   Occupational History  . Not on file.   Social History Main Topics  . Smoking status: Former Smoker    Types: Cigarettes    Quit date: 02/23/1975  . Smokeless tobacco: Current User    Types: Chew     Comment: smoked for only 6 mos out of his whole life  . Alcohol Use: No     Comment: no drinking in the past 3 months  . Drug Use: No  . Sexual Activity: Not Currently   Other Topics Concern  . Not on file   Social History Narrative   Caffeine use:2 per day.    No Known Allergies   Review of Systems  Constitutional: Negative for fever, chills, weight loss, malaise/fatigue and fatigue.  HENT: Negative for ear discharge, ear pain and sore throat.   Eyes: Negative for blurred vision.  Respiratory: Negative for cough, sputum production, shortness of breath and wheezing.   Cardiovascular: Negative for chest pain, palpitations and leg swelling.  Gastrointestinal: Negative for heartburn, nausea, abdominal pain, diarrhea, constipation, blood in stool and melena.  Genitourinary: Negative for dysuria, urgency, frequency and hematuria.  Musculoskeletal: Negative for myalgias, back pain, joint pain and neck pain.  Skin: Negative for pallor and rash.  Neurological: Negative for dizziness, tingling, tremors, sensory change, focal weakness, seizures, speech difficulty, weakness and headaches.  Endo/Heme/Allergies: Negative for environmental allergies,  polydipsia and polyphagia. Does not bruise/bleed easily.  Psychiatric/Behavioral: Negative for depression, suicidal ideas and confusion. The patient is not nervous/anxious and does not have insomnia.      Objective  Filed Vitals:   08/27/15 0928  BP: 136/76  Pulse: 64  Height: 6\' 1"  (1.854 m)  Weight: 292 lb (132.45 kg)    Physical Exam  Constitutional: He is oriented to person, place, and time and  well-developed, well-nourished, and in no distress.  HENT:  Head: Normocephalic.  Right Ear: External ear normal.  Left Ear: External ear normal.  Nose: Nose normal.  Mouth/Throat: Oropharynx is clear and moist.  Eyes: Conjunctivae and EOM are normal. Pupils are equal, round, and reactive to light. Right eye exhibits no discharge. Left eye exhibits no discharge. No scleral icterus.  Neck: Normal range of motion. Neck supple. No JVD present. No tracheal deviation present. No thyromegaly present.  Cardiovascular: Normal rate, regular rhythm, normal heart sounds and intact distal pulses.  Exam reveals no gallop and no friction rub.   No murmur heard. Pulmonary/Chest: Breath sounds normal. No respiratory distress. He has no wheezes. He has no rales.  Abdominal: Soft. Bowel sounds are normal. He exhibits no mass. There is no hepatosplenomegaly. There is no tenderness. There is no rebound, no guarding and no CVA tenderness.  Musculoskeletal: Normal range of motion. He exhibits no edema or tenderness.  Lymphadenopathy:    He has no cervical adenopathy.  Neurological: He is alert and oriented to person, place, and time. He has normal sensation, normal strength, normal reflexes and intact cranial nerves. No cranial nerve deficit.  Skin: Skin is warm. No rash noted.  Psychiatric: Mood and affect normal.  Nursing note and vitals reviewed.     Assessment & Plan  Problem List Items Addressed This Visit      Endocrine   Diabetes mellitus, type 2 ([place]) - Primary    Other Visit Diagnoses    Need for Tdap vaccination        Relevant Orders    Tdap vaccine greater than or equal to 7yo IM (Completed)         Dr. [facility]  08/27/2015

## 2015-10-03 ENCOUNTER — Ambulatory Visit: Payer: PPO | Admitting: Family Medicine

## 2016-03-11 DIAGNOSIS — E119 Type 2 diabetes mellitus without complications: Secondary | ICD-10-CM | POA: Diagnosis not present

## 2016-03-11 DIAGNOSIS — E089 Diabetes mellitus due to underlying condition without complications: Secondary | ICD-10-CM | POA: Diagnosis not present

## 2016-03-11 DIAGNOSIS — H524 Presbyopia: Secondary | ICD-10-CM | POA: Diagnosis not present

## 2016-04-23 IMAGING — CT CT ABD-PEL WO/W CM
3 of 10 series · 12 of 46 positions shown, 18 images · IV contrast (omnipaque)
Comparison: None.

CLINICAL DATA: Painless microscopic hematuria. No history of
malignancy. Initial encounter.

EXAM:
CT ABDOMEN AND PELVIS WITHOUT AND WITH CONTRAST
TECHNIQUE: Multidetector CT imaging of the abdomen and pelvis was performed
following the standard protocol before and following the bolus
administration of intravenous contrast.
CONTRAST:  125mL OMNIPAQUE IOHEXOL 300 MG/ML  SOLN

[Series 2: hematuria > 45 wo · axial · 0.95mm/px · z∈[-1032,-977]mm · 2 of 98 slices shown]
[im 11/98  soft-tissue]
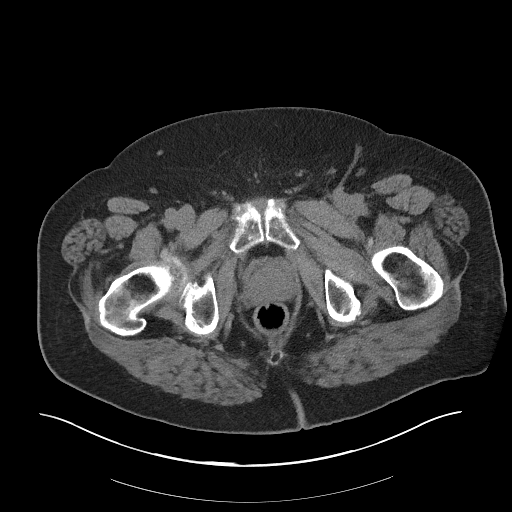
[im 22/98  soft-tissue]
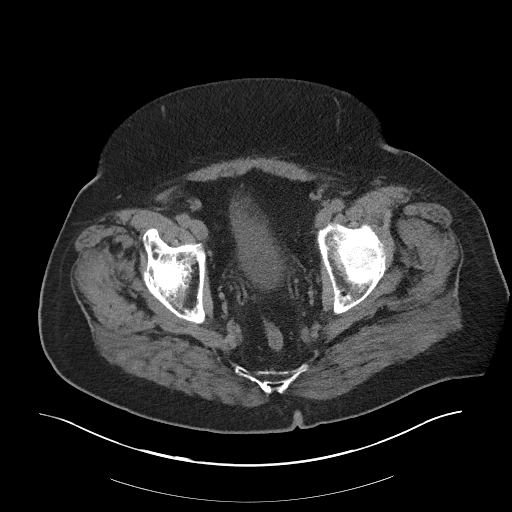

[Series 5: cor hematuria > 45 wo · coronal · 0.93mm/px · 2 of 189 slices shown, 3 images]
[im 63/189  soft-tissue]
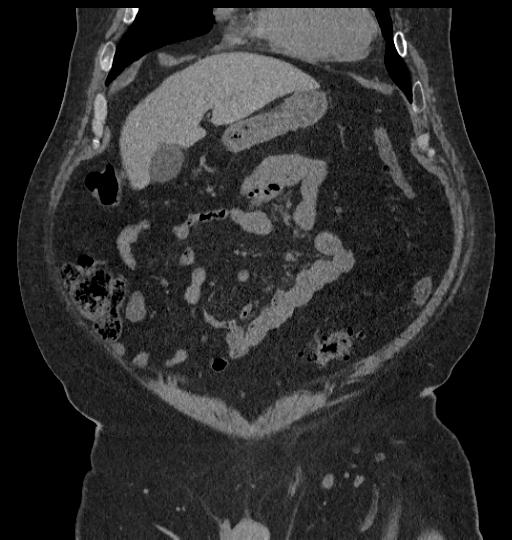
[im 63/189  bone]
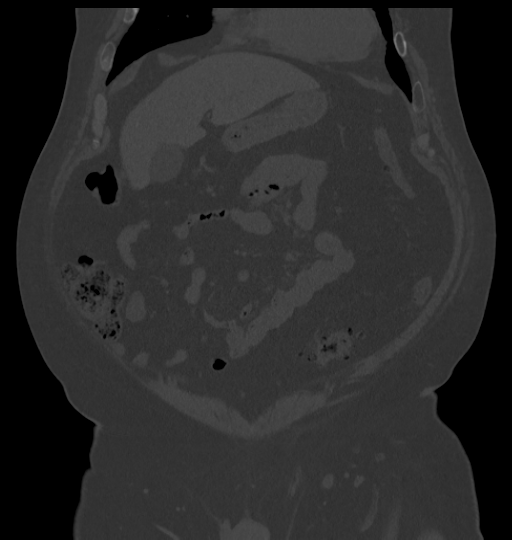
[im 126/189  soft-tissue]
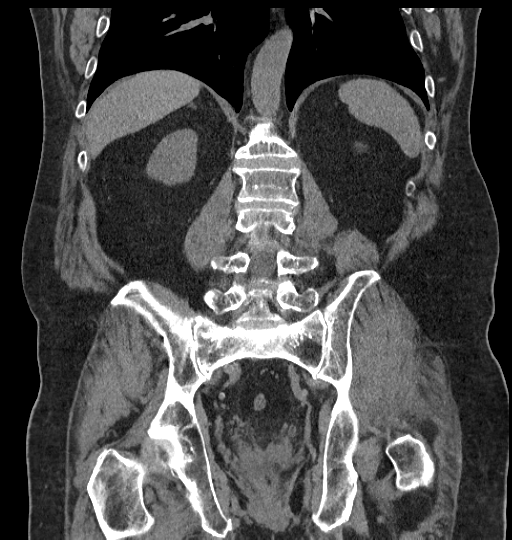

[Series 12: delay · axial · delayed · 0.95mm/px · z∈[-970,-550]mm · 8 of 108 slices shown, 13 images]
[im 12/108  soft-tissue]
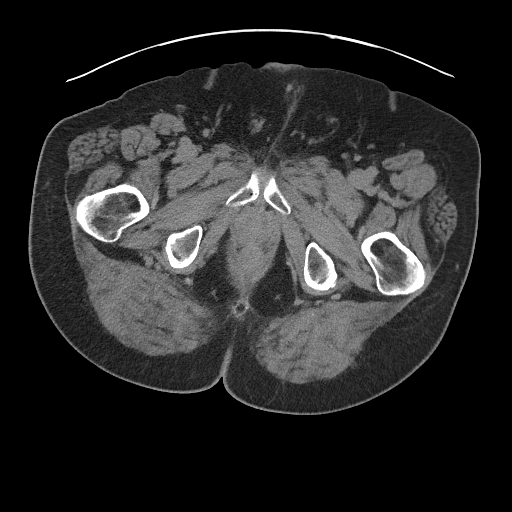
[im 12/108  bone]
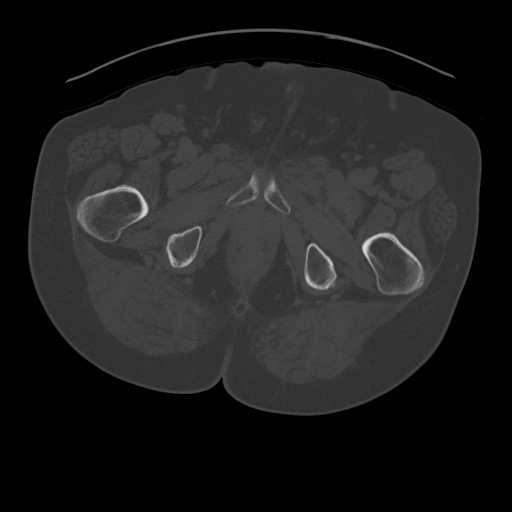
[im 24/108  soft-tissue]
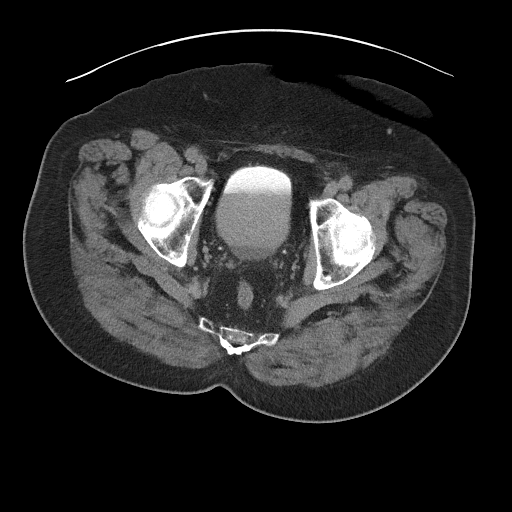
[im 36/108  soft-tissue]
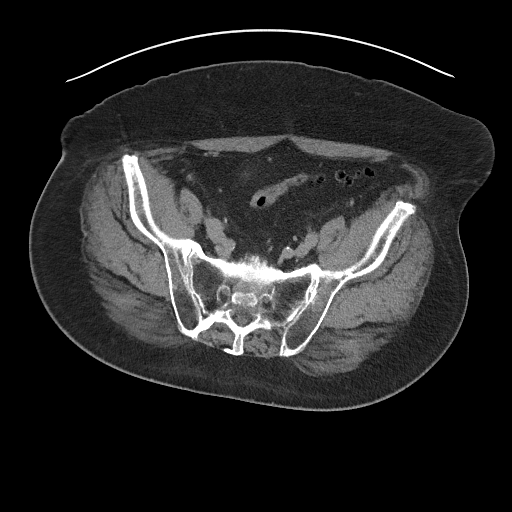
[im 48/108  soft-tissue]
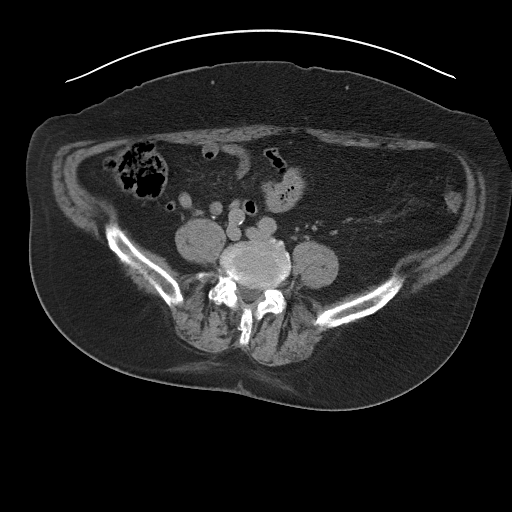
[im 60/108  soft-tissue]
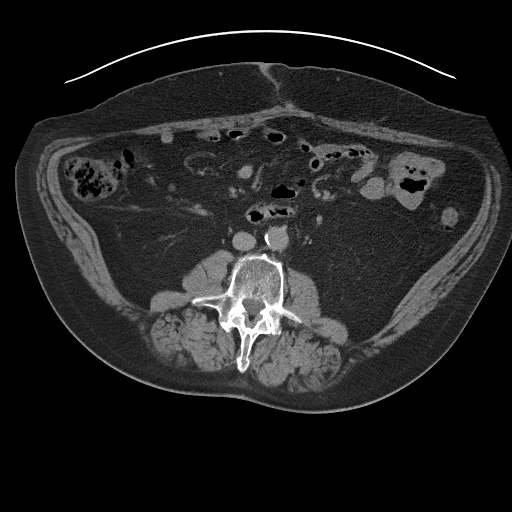
[im 60/108  lung]
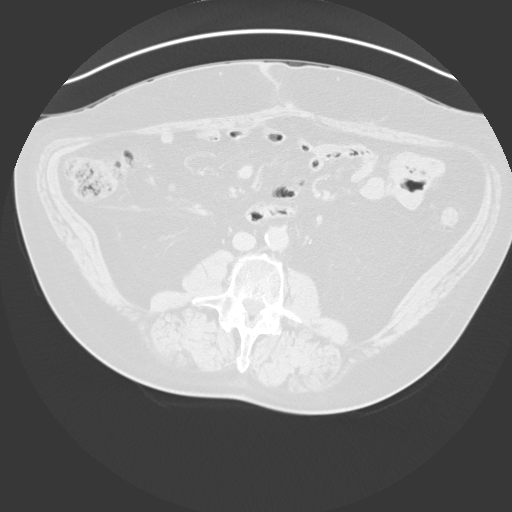
[im 72/108  soft-tissue]
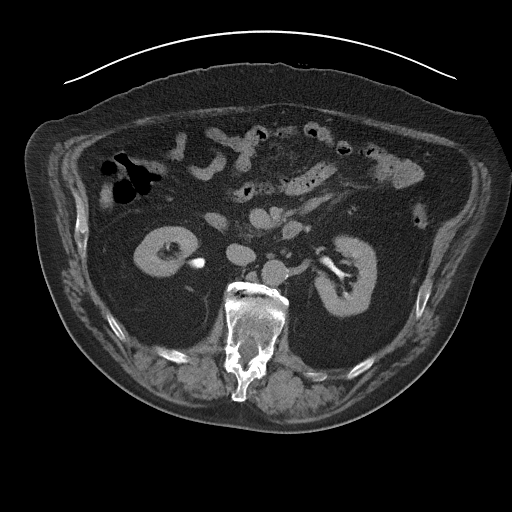
[im 72/108  lung]
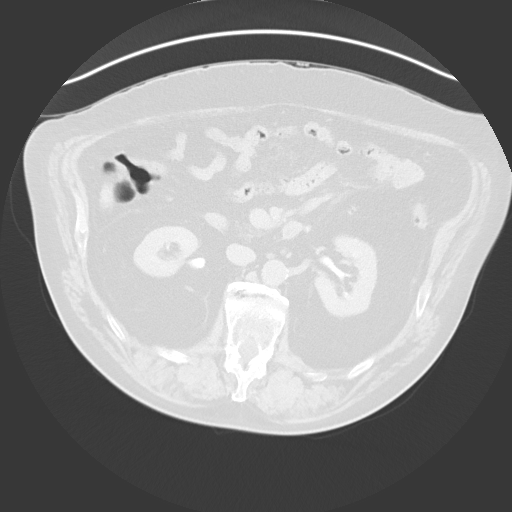
[im 84/108  soft-tissue]
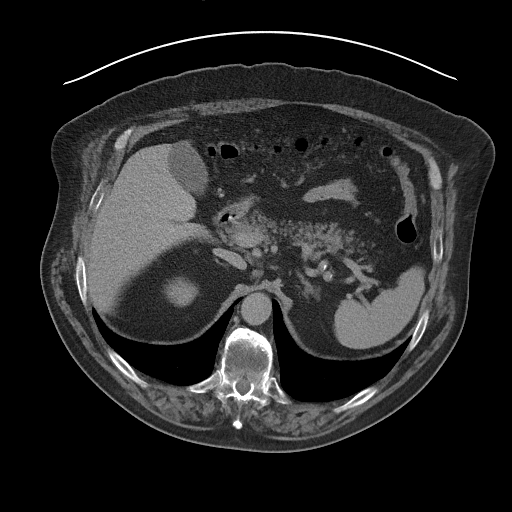
[im 84/108  lung]
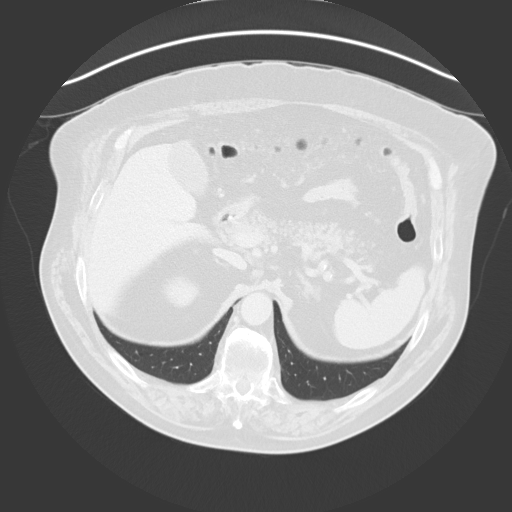
[im 96/108  soft-tissue]
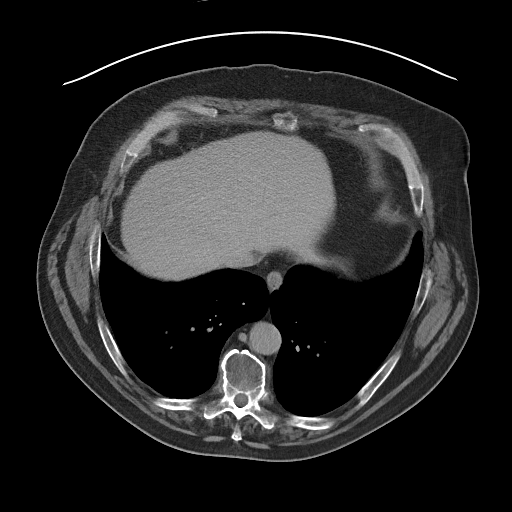
[im 96/108  lung]
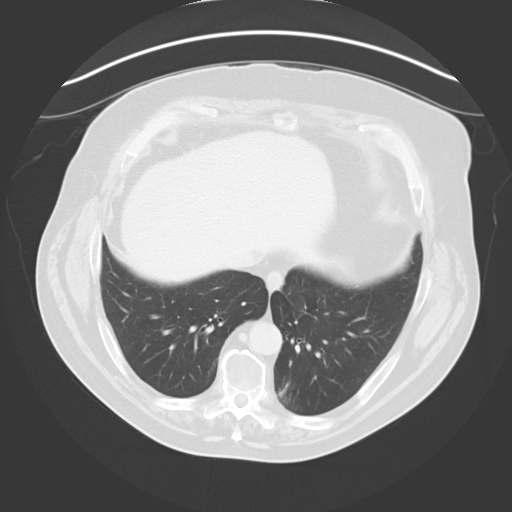

[12 of 46 positions shown; findings below may reference images not displayed]

FINDINGS: Lower chest: Mild dependent left lower lobe atelectasis. The lung
bases are otherwise clear. No significant pleural or pericardial
effusion.

Hepatobiliary: The liver is normal in density without focal
abnormality. No evidence of gallstones, gallbladder wall thickening
or biliary dilatation.

Pancreas: Unremarkable. No pancreatic ductal dilatation or
surrounding inflammatory changes.

Spleen: Normal in size without focal abnormality.

Adrenals/Urinary Tract: Both adrenal glands appear
normal.Pre-contrast images demonstrate no renal, ureteral or bladder
calculi. Post-contrast, both kidneys enhance normally. There is no
evidence of enhancing renal mass. Delayed images result in segmental
visualization of the ureters. No urothelial abnormalities are
identified. The bladder appears unremarkable.

Stomach/Bowel: No evidence of bowel wall thickening, distention or
surrounding inflammatory change.The stomach is incompletely
distended. There are diverticular changes throughout the colon. The
appendix appears normal.

Vascular/Lymphatic: There are no enlarged abdominal or pelvic lymph
nodes. Mild atherosclerosis of the aorta, its branches and the iliac
arteries.

Reproductive: Unremarkable.

Other: Postsurgical changes around the umbilicus consistent with
previous hernia repair. Mildly prominent fat in both inguinal
canals.

Musculoskeletal: No acute or significant osseous findings. There are
degenerative changes throughout the spine with paraspinal
osteophytes in the lower thoracic region. The sacroiliac joints do
not appear ankylosed. Bilateral femoral neck morphology suspicious
for femoroacetabular impingement, especially on the left.
IMPRESSION: 1. No cause for hematuria identified. There is no evidence of
urinary tract calculus, renal mass or urothelial lesion.
2. No acute abdominal pelvic findings.
3. Diffuse colonic diverticulosis.
4. Mild atherosclerosis.

## 2016-05-06 ENCOUNTER — Other Ambulatory Visit: Payer: Self-pay | Admitting: Family Medicine

## 2016-05-06 DIAGNOSIS — E785 Hyperlipidemia, unspecified: Secondary | ICD-10-CM

## 2016-05-18 IMAGING — RF DG ABDOMEN 2V
4 series · 4 of 4 positions shown · non-contrast
Comparison: none

[Series 1: pädiatrie · 1 of 1 slices shown (1 of 4)]
[im 1/1]
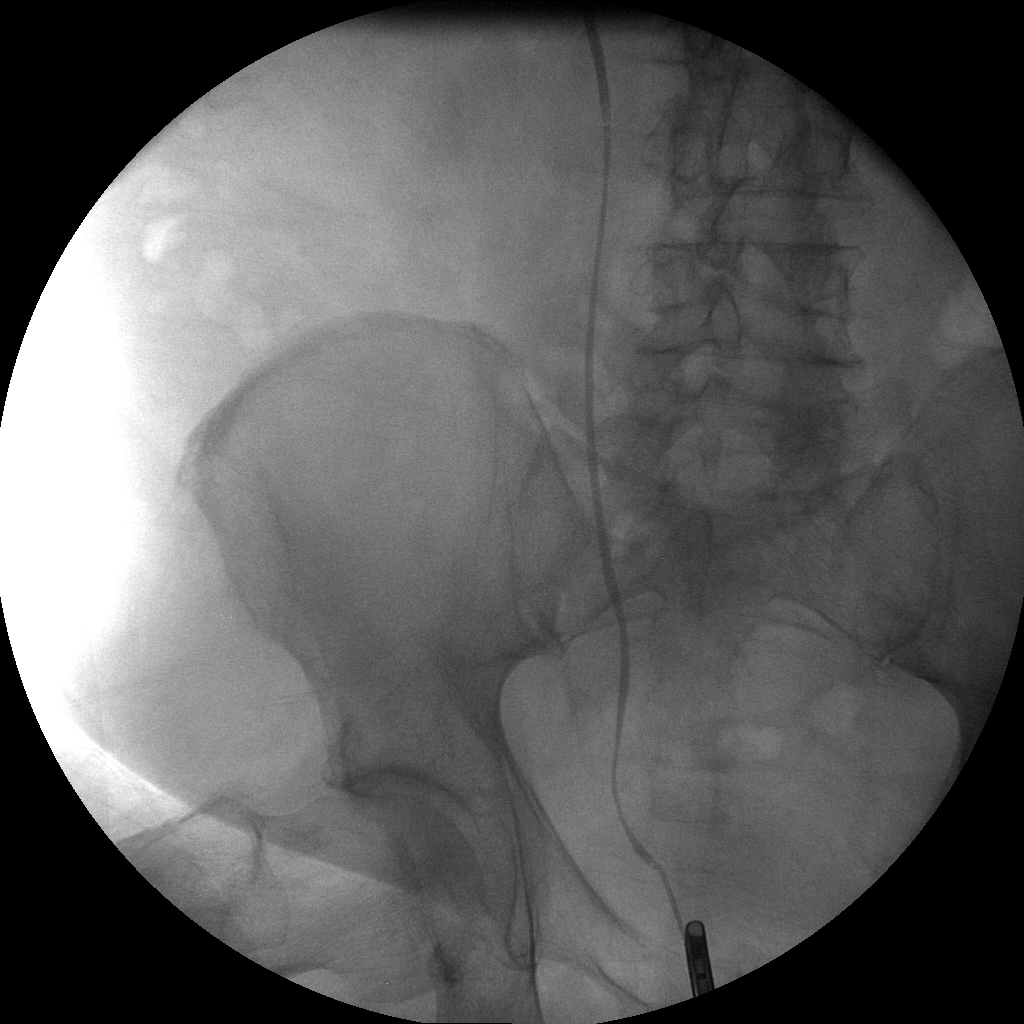

[Series 2: pädiatrie · 1 of 1 slices shown (2 of 4)]
[im 1/1]
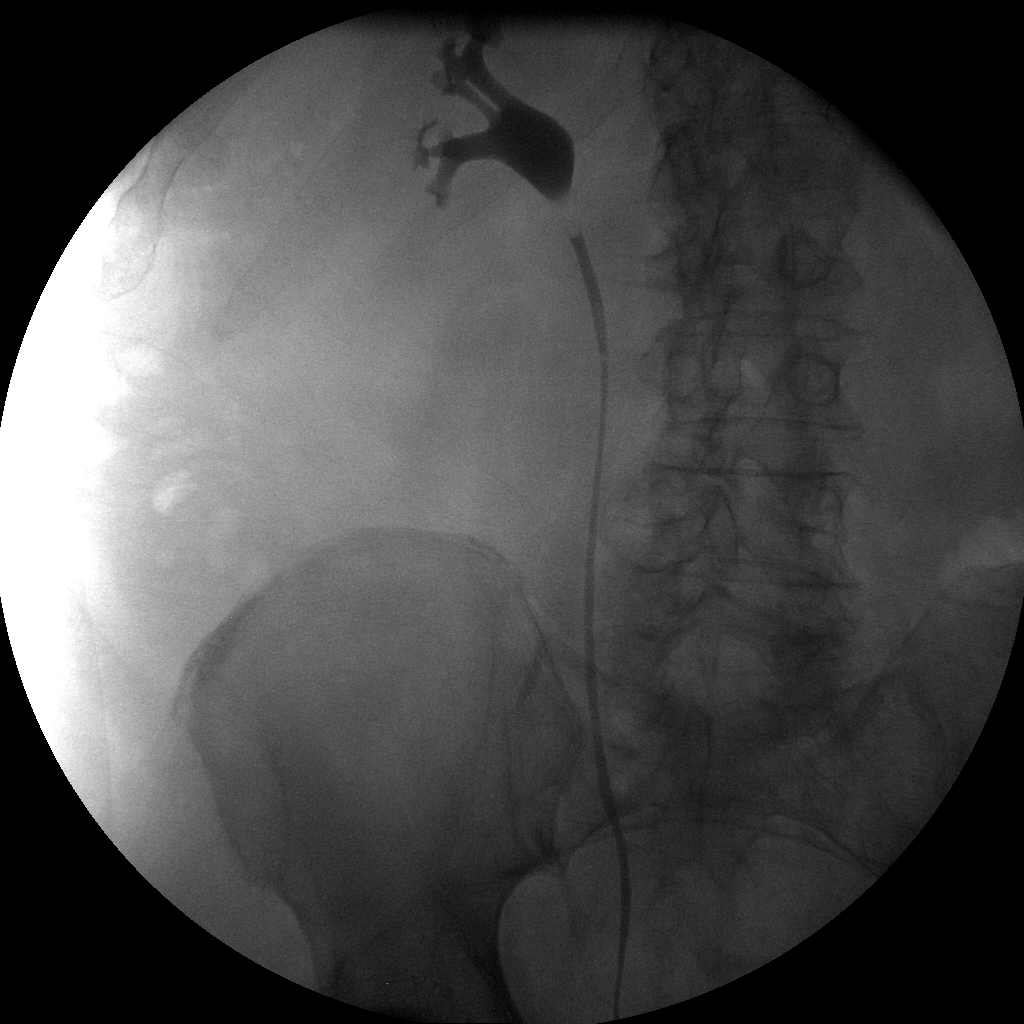

[Series 3: pädiatrie · 1 of 1 slices shown (3 of 4)]
[im 1/1]
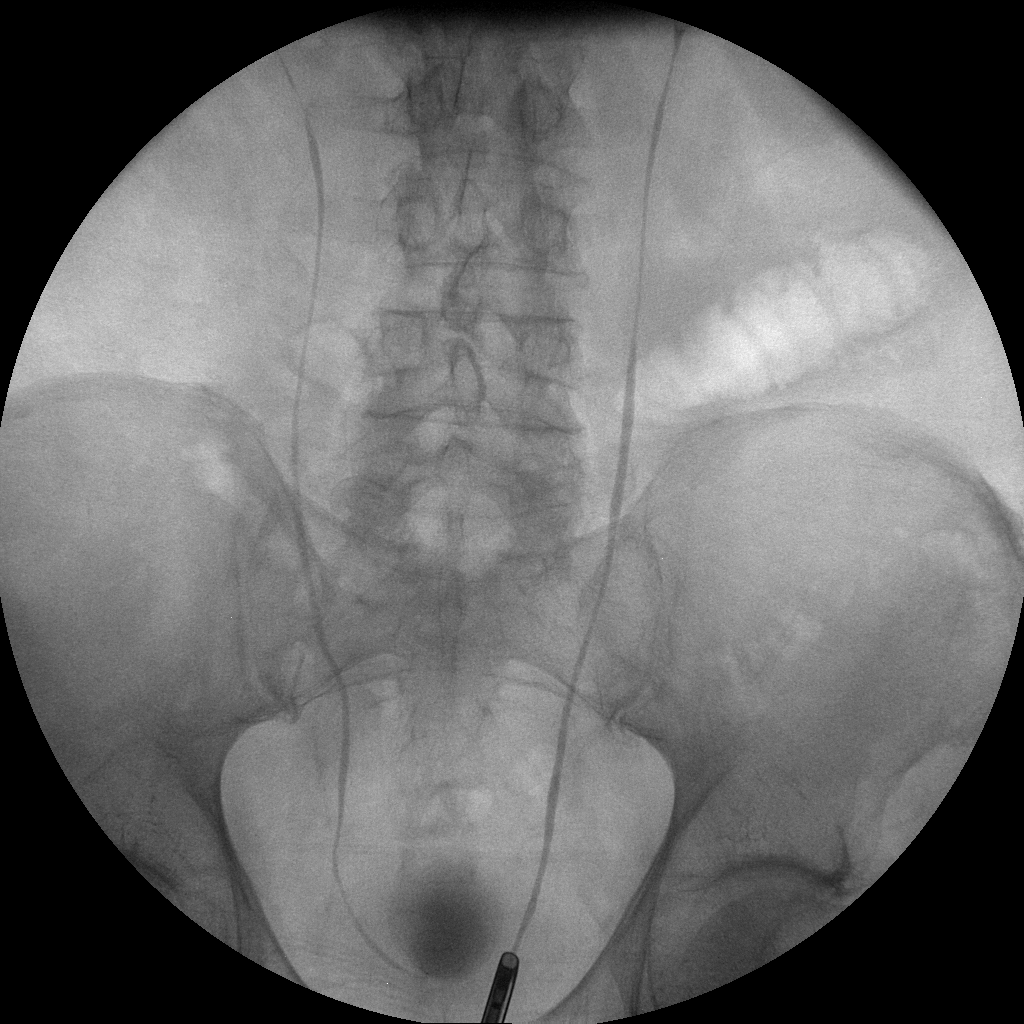

[Series 4: pädiatrie · 1 of 1 slices shown (4 of 4)]
[im 1/1]
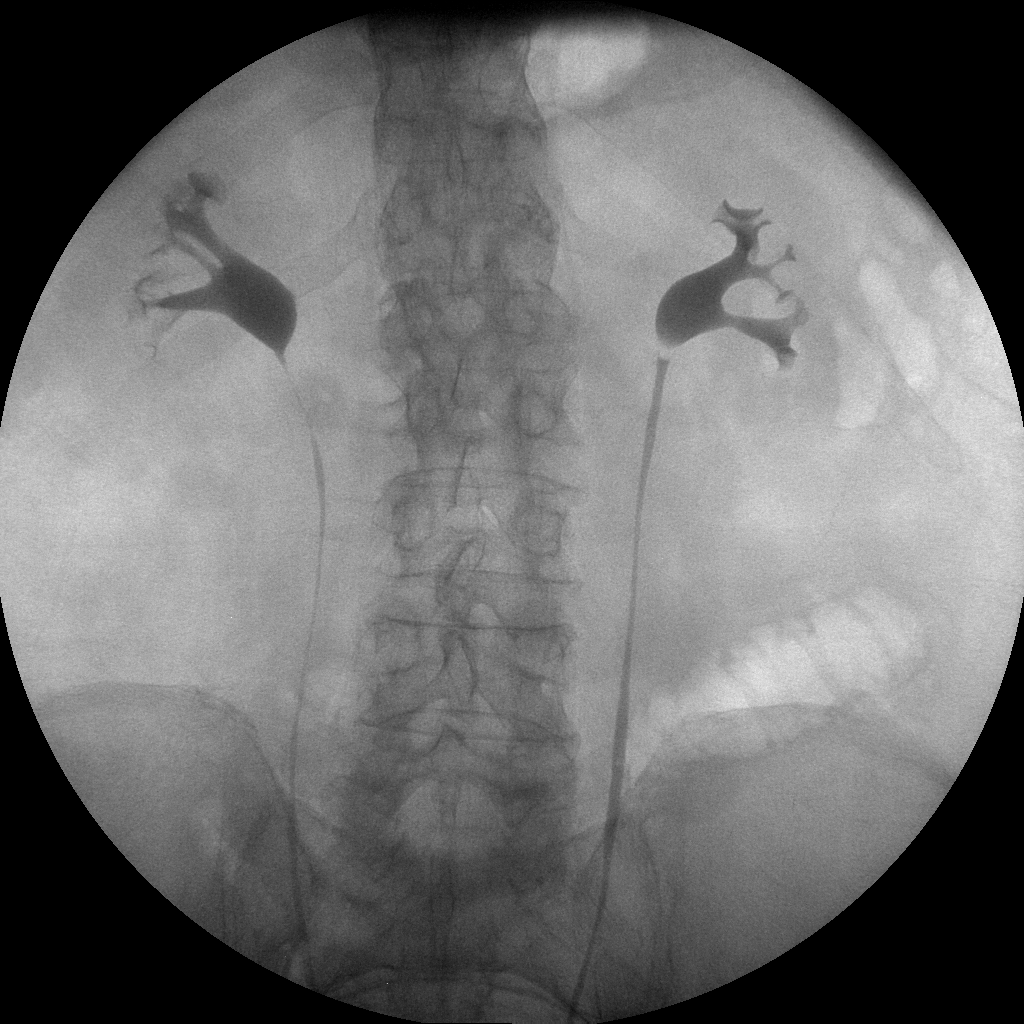

[4 of 4 positions shown; findings below may reference images not displayed]

Canned report from images found in remote index.

Refer to host system for actual result text.

## 2016-06-07 ENCOUNTER — Other Ambulatory Visit: Payer: Self-pay | Admitting: Family Medicine

## 2016-06-07 DIAGNOSIS — E785 Hyperlipidemia, unspecified: Secondary | ICD-10-CM

## 2016-07-03 ENCOUNTER — Ambulatory Visit (INDEPENDENT_AMBULATORY_CARE_PROVIDER_SITE_OTHER): Payer: PPO | Admitting: Family Medicine

## 2016-07-03 ENCOUNTER — Encounter: Payer: Self-pay | Admitting: Family Medicine

## 2016-07-03 VITALS — BP 136/86 | HR 86 | Temp 98.0°F | Ht 73.0 in | Wt 290.0 lb

## 2016-07-03 DIAGNOSIS — E118 Type 2 diabetes mellitus with unspecified complications: Secondary | ICD-10-CM | POA: Diagnosis not present

## 2016-07-03 DIAGNOSIS — E782 Mixed hyperlipidemia: Secondary | ICD-10-CM

## 2016-07-03 DIAGNOSIS — Z23 Encounter for immunization: Secondary | ICD-10-CM | POA: Diagnosis not present

## 2016-07-03 MED ORDER — ATORVASTATIN CALCIUM 40 MG PO TABS: 40.0000 mg | ORAL_TABLET | Freq: Every day | ORAL | 3 refills | Status: DC

## 2016-07-03 NOTE — Progress Notes (Signed)
Name: Troy Rodriguez   MRN: [id number]    DOB: March 18, 1941   Date:07/03/2016       Progress Note  Subjective  Chief Complaint  Chief Complaint  Patient presents with  . Hyperlipidemia    refill  . Blood Sugar Problem    Aic 119    Hyperlipidemia  This is a chronic problem. The problem is controlled. Recent lipid tests were reviewed and are normal. Exacerbating diseases include obesity. He has no history of chronic renal disease, diabetes, hypothyroidism, liver disease or nephrotic syndrome. There are no known factors aggravating his hyperlipidemia. Pertinent negatives include no chest pain, focal sensory loss, focal weakness, leg pain, myalgias or shortness of breath. He is currently on no antihyperlipidemic treatment. The current treatment provides moderate improvement of lipids. There are no compliance problems.  There are no known risk factors for coronary artery disease.  Diabetes  He presents for his follow-up diabetic visit. He has type 2 diabetes mellitus. His disease course has been stable. There are no hypoglycemic associated symptoms. Pertinent negatives for hypoglycemia include no dizziness, headaches or nervousness/anxiousness. Pertinent negatives for diabetes include no blurred vision, no chest pain, no fatigue, no foot paresthesias, no foot ulcerations, no polydipsia, no polyphagia, no polyuria, no visual change, no weakness and no weight loss. There are no hypoglycemic complications. Symptoms are stable. There are no diabetic complications. Risk factors for coronary artery disease include diabetes mellitus and dyslipidemia. Current diabetic treatment includes diet. He is compliant with treatment all of the time. His weight is stable. He is following a generally healthy diet. He participates in exercise daily. His breakfast blood glucose is taken between 8-9 am. His breakfast blood glucose range is generally 110-130 mg/dl. An ACE inhibitor/angiotensin II receptor blocker is not being  taken. He does not see a podiatrist.Eye exam is not current.    No problem-specific Assessment & Plan notes found for this encounter.   Past Medical History:  Diagnosis Date  . Arthritis   . Diabetes ([place])   . Hematuria   . HLD (hyperlipidemia)   . Umbilical hernia     Past Surgical History:  Procedure Laterality Date  . CATARACT EXTRACTION Bilateral 2003  . CYSTOSCOPY W/ RETROGRADES Bilateral 02/26/2015   Procedure: CYSTOSCOPY WITH RETROGRADE PYELOGRAM;  Surgeon: Hollice Espy, MD;  Location: [facility] ORS;  Service: Urology;  Laterality: Bilateral;  . HERNIA REPAIR  2015    Family History  Problem Relation Age of Onset  . Prostate cancer Brother   . Kidney disease Neg Hx     Social History   Social History  . Marital status: Married    Spouse name: N/A  . Number of children: N/A  . Years of education: N/A   Occupational History  . Not on file.   Social History Main Topics  . Smoking status: Former Smoker    Types: Cigarettes    Quit date: 02/23/1975  . Smokeless tobacco: Current User    Types: Chew     Comment: smoked for only 6 mos out of his whole life  . Alcohol use No     Comment: no drinking in the past 3 months  . Drug use: No  . Sexual activity: Not Currently   Other Topics Concern  . Not on file   Social History Narrative   Caffeine use:2 per day.    No Known Allergies   Review of Systems  Constitutional: Negative for chills, fatigue, fever, malaise/fatigue and weight loss.  HENT: Negative for  ear discharge, ear pain and sore throat.   Eyes: Negative for blurred vision.  Respiratory: Negative for cough, sputum production, shortness of breath and wheezing.   Cardiovascular: Negative for chest pain, palpitations and leg swelling.  Gastrointestinal: Negative for abdominal pain, blood in stool, constipation, diarrhea, heartburn, melena and nausea.  Genitourinary: Negative for dysuria, frequency, hematuria and urgency.  Musculoskeletal: Negative for  back pain, joint pain, myalgias and neck pain.  Skin: Negative for rash.  Neurological: Negative for dizziness, tingling, sensory change, focal weakness, weakness and headaches.  Endo/Heme/Allergies: Negative for environmental allergies, polydipsia and polyphagia. Does not bruise/bleed easily.  Psychiatric/Behavioral: Negative for depression and suicidal ideas. The patient is not nervous/anxious and does not have insomnia.      Objective  Vitals:   07/03/16 0843  BP: 136/86  Pulse: 86  Temp: 98 F (36.7 C)  SpO2: 95%  Weight: 290 lb (131.5 kg)  Height: 6\' 1"  (1.854 m)    Physical Exam  Constitutional: He is oriented to person, place, and time and well-developed, well-nourished, and in no distress.  HENT:  Head: Normocephalic.  Right Ear: External ear normal.  Left Ear: External ear normal.  Nose: Nose normal.  Mouth/Throat: Oropharynx is clear and moist.  Eyes: Conjunctivae and EOM are normal. Pupils are equal, round, and reactive to light. Right eye exhibits no discharge. Left eye exhibits no discharge. No scleral icterus.  Neck: Normal range of motion. Neck supple. No JVD present. No tracheal deviation present. No thyromegaly present.  Cardiovascular: Normal rate, regular rhythm, normal heart sounds and intact distal pulses.  Exam reveals no gallop and no friction rub.   No murmur heard. Pulmonary/Chest: Breath sounds normal. No respiratory distress. He has no wheezes. He has no rales.  Abdominal: Soft. Bowel sounds are normal. He exhibits no mass. There is no hepatosplenomegaly. There is no tenderness. There is no rebound, no guarding and no CVA tenderness.  Musculoskeletal: Normal range of motion. He exhibits no edema or tenderness.  Lymphadenopathy:    He has no cervical adenopathy.  Neurological: He is alert and oriented to person, place, and time. He has normal sensation, normal strength, normal reflexes and intact cranial nerves. No cranial nerve deficit.  Skin: Skin is  warm. No rash noted.  Psychiatric: Mood and affect normal.  Nursing note and vitals reviewed.     Assessment & Plan  Problem List Items Addressed This Visit      Endocrine   Diabetes mellitus, type 2 ([place]) - Primary   Relevant Medications   atorvastatin (LIPITOR) 40 MG tablet   Other Relevant Orders   Hemoglobin A1c     Other   Need for vaccination   Relevant Orders   Flu Vaccine QUAD 36+ mos PF IM (Fluarix & Fluzone Quad PF)    Other Visit Diagnoses    Mixed hyperlipidemia       omega 3 otc   Relevant Medications   atorvastatin (LIPITOR) 40 MG tablet   Other Relevant Orders   Lipid Profile   Encounter for immunization       Relevant Orders   Flu Vaccine QUAD 36+ mos IM (Completed)        Dr. Kamarian Sahakian [facility]  07/03/16

## 2016-07-04 LAB — LIPID PANEL
CHOLESTEROL TOTAL: 151 mg/dL (ref 100–199)
Chol/HDL Ratio: 5.2 ratio units — ABNORMAL HIGH (ref 0.0–5.0)
HDL: 29 mg/dL — ABNORMAL LOW (ref >39)
LDL CALC: 83 mg/dL (ref 0–99)
Triglycerides: 197 mg/dL — ABNORMAL HIGH (ref 0–149)
VLDL Cholesterol Cal: 39 mg/dL (ref 5–40)

## 2016-07-04 LAB — HEMOGLOBIN A1C
ESTIMATED AVERAGE GLUCOSE: 134 mg/dL
Hgb A1c MFr Bld: 6.3 % — ABNORMAL HIGH (ref 4.8–5.6)

## 2016-07-11 ENCOUNTER — Other Ambulatory Visit: Payer: Self-pay | Admitting: Family Medicine

## 2016-07-11 DIAGNOSIS — E785 Hyperlipidemia, unspecified: Secondary | ICD-10-CM

## 2016-12-29 ENCOUNTER — Ambulatory Visit (INDEPENDENT_AMBULATORY_CARE_PROVIDER_SITE_OTHER): Payer: PPO

## 2016-12-29 VITALS — BP 150/70 | HR 80 | Temp 98.0°F | Resp 16 | Ht 73.0 in | Wt 283.6 lb

## 2016-12-29 DIAGNOSIS — Z23 Encounter for immunization: Secondary | ICD-10-CM | POA: Diagnosis not present

## 2016-12-29 DIAGNOSIS — Z0000 Encounter for general adult medical examination without abnormal findings: Secondary | ICD-10-CM | POA: Diagnosis not present

## 2016-12-29 NOTE — Patient Instructions (Signed)
Troy Rodriguez , Thank you for taking time to come for your Medicare Wellness Visit. I appreciate your ongoing commitment to your health goals. Please review the following plan we discussed and let me know if I can assist you in the future.   Screening recommendations/referrals: Colonoscopy: Completed 05/20/2010, no longer required Recommended yearly ophthalmology/optometry visit for glaucoma screening and checkup Recommended yearly dental visit for hygiene and checkup  Vaccinations: Influenza vaccine: up to date, due 06/2017 Pneumococcal vaccine: Pneumovax 23 done today  Tdap vaccine: up to date Shingles vaccine: up to date  Advanced directives: PLease bring a copy at your convenience.   Conditions/risks identified: Recommend to drink 4-5 glasses of water a day  Next appointment: Follow up with Dr. Ronnald Ramp on 01/01/2017 at 8:15am. Follow up in one year for your annual wellness exam.   Preventive Care 65 Years and Older, Male Preventive care refers to lifestyle choices and visits with your health care provider that can promote health and wellness. What does preventive care include?  A yearly physical exam. This is also called an annual well check.  Dental exams once or twice a year.  Routine eye exams. Ask your health care provider how often you should have your eyes checked.  Personal lifestyle choices, including:  Daily care of your teeth and gums.  Regular physical activity.  Eating a healthy diet.  Avoiding tobacco and drug use.  Limiting alcohol use.  Practicing safe sex.  Taking low doses of aspirin every day.  Taking vitamin and mineral supplements as recommended by your health care provider. What happens during an annual well check? The services and screenings done by your health care provider during your annual well check will depend on your age, overall health, lifestyle risk factors, and family history of disease. Counseling  Your health care provider may ask you  questions about your:  Alcohol use.  Tobacco use.  Drug use.  Emotional well-being.  Home and relationship well-being.  Sexual activity.  Eating habits.  History of falls.  Memory and ability to understand (cognition).  Work and work Statistician. Screening  You may have the following tests or measurements:  Height, weight, and BMI.  Blood pressure.  Lipid and cholesterol levels. These may be checked every 5 years, or more frequently if you are over 33 years old.  Skin check.  Lung cancer screening. You may have this screening every year starting at age 46 if you have a 30-pack-year history of smoking and currently smoke or have quit within the past 15 years.  Fecal occult blood test (FOBT) of the stool. You may have this test every year starting at age 28.  Flexible sigmoidoscopy or colonoscopy. You may have a sigmoidoscopy every 5 years or a colonoscopy every 10 years starting at age 40.  Prostate cancer screening. Recommendations will vary depending on your family history and other risks.  Hepatitis C blood test.  Hepatitis B blood test.  Sexually transmitted disease (STD) testing.  Diabetes screening. This is done by checking your blood sugar (glucose) after you have not eaten for a while (fasting). You may have this done every 1-3 years.  Abdominal aortic aneurysm (AAA) screening. You may need this if you are a current or former smoker.  Osteoporosis. You may be screened starting at age 62 if you are at high risk. Talk with your health care provider about your test results, treatment options, and if necessary, the need for more tests. Vaccines  Your health care provider may recommend  certain vaccines, such as:  Influenza vaccine. This is recommended every year.  Tetanus, diphtheria, and acellular pertussis (Tdap, Td) vaccine. You may need a Td booster every 10 years.  Zoster vaccine. You may need this after age 52.  Pneumococcal 13-valent conjugate  (PCV13) vaccine. One dose is recommended after age 56.  Pneumococcal polysaccharide (PPSV23) vaccine. One dose is recommended after age 34. Talk to your health care provider about which screenings and vaccines you need and how often you need them. This information is not intended to replace advice given to you by your health care provider. Make sure you discuss any questions you have with your health care provider. Document Released: 08/10/2015 Document Revised: 04/02/2016 Document Reviewed: 05/15/2015 Elsevier Interactive Patient Education  2017 [place] Prevention in the Home Falls can cause injuries. They can happen to people of all ages. There are many things you can do to make your home safe and to help prevent falls. What can I do on the outside of my home?  Regularly fix the edges of walkways and driveways and fix any cracks.  Remove anything that might make you trip as you walk through a door, such as a raised step or threshold.  Trim any bushes or trees on the path to your home.  Use bright outdoor lighting.  Clear any walking paths of anything that might make someone trip, such as rocks or tools.  Regularly check to see if handrails are loose or broken. Make sure that both sides of any steps have handrails.  Any raised decks and porches should have guardrails on the edges.  Have any leaves, snow, or ice cleared regularly.  Use sand or salt on walking paths during winter.  Clean up any spills in your garage right away. This includes oil or grease spills. What can I do in the bathroom?  Use night lights.  Install grab bars by the toilet and in the tub and shower. Do not use towel bars as grab bars.  Use non-skid mats or decals in the tub or shower.  If you need to sit down in the shower, use a plastic, non-slip stool.  Keep the floor dry. Clean up any water that spills on the floor as soon as it happens.  Remove soap buildup in the tub or shower  regularly.  Attach bath mats securely with double-sided non-slip rug tape.  Do not have throw rugs and other things on the floor that can make you trip. What can I do in the bedroom?  Use night lights.  Make sure that you have a light by your bed that is easy to reach.  Do not use any sheets or blankets that are too big for your bed. They should not hang down onto the floor.  Have a firm chair that has side arms. You can use this for support while you get dressed.  Do not have throw rugs and other things on the floor that can make you trip. What can I do in the kitchen?  Clean up any spills right away.  Avoid walking on wet floors.  Keep items that you use a lot in easy-to-reach places.  If you need to reach something above you, use a strong step stool that has a grab bar.  Keep electrical cords out of the way.  Do not use floor polish or wax that makes floors slippery. If you must use wax, use non-skid floor wax.  Do not have throw rugs and other  things on the floor that can make you trip. What can I do with my stairs?  Do not leave any items on the stairs.  Make sure that there are handrails on both sides of the stairs and use them. Fix handrails that are broken or loose. Make sure that handrails are as long as the stairways.  Check any carpeting to make sure that it is firmly attached to the stairs. Fix any carpet that is loose or worn.  Avoid having throw rugs at the top or bottom of the stairs. If you do have throw rugs, attach them to the floor with carpet tape.  Make sure that you have a light switch at the top of the stairs and the bottom of the stairs. If you do not have them, ask someone to add them for you. What else can I do to help prevent falls?  Wear shoes that:  Do not have high heels.  Have rubber bottoms.  Are comfortable and fit you well.  Are closed at the toe. Do not wear sandals.  If you use a stepladder:  Make sure that it is fully  opened. Do not climb a closed stepladder.  Make sure that both sides of the stepladder are locked into place.  Ask someone to hold it for you, if possible.  Clearly mark and make sure that you can see:  Any grab bars or handrails.  First and last steps.  Where the edge of each step is.  Use tools that help you move around (mobility aids) if they are needed. These include:  Canes.  Walkers.  Scooters.  Crutches.  Turn on the lights when you go into a dark area. Replace any light bulbs as soon as they burn out.  Set up your furniture so you have a clear path. Avoid moving your furniture around.  If any of your floors are uneven, fix them.  If there are any pets around you, be aware of where they are.  Review your medicines with your doctor. Some medicines can make you feel dizzy. This can increase your chance of falling. Ask your doctor what other things that you can do to help prevent falls. This information is not intended to replace advice given to you by your health care provider. Make sure you discuss any questions you have with your health care provider. Document Released: 05/10/2009 Document Revised: 12/20/2015 Document Reviewed: 08/18/2014 Elsevier Interactive Patient Education  2017 [employer].

## 2016-12-29 NOTE — Progress Notes (Signed)
Subjective:   Troy Rodriguez is a 76 y.o. male who presents for an Initial Medicare Annual Wellness Visit.  Review of Systems   Cardiac Risk Factors include: advanced age (>9men, >69 women);obesity (BMI >30kg/m2);hypertension;dyslipidemia;diabetes mellitus;male gender    Objective:    Today's Vitals   12/29/16 1341  BP: (!) 150/70  Pulse: 80  Resp: 16  Temp: 98 F (36.7 C)  Weight: 283 lb 9.6 oz (128.6 kg)  Height: 6\' 1"  (1.854 m)   Body mass index is 37.42 kg/m.  Current Medications (verified) Outpatient Encounter Prescriptions as of 12/29/2016  Medication Sig  . aspirin 81 MG tablet Take 1 tablet by mouth daily.  Marland Kitchen atorvastatin (LIPITOR) 40 MG tablet TAKE 1 TABLET (40 MG TOTAL) BY MOUTH DAILY.  Marland Kitchen Omega-3 Fatty Acids (FISH OIL PO) Take 1 capsule by mouth daily. Reported on 08/08/2015  . [DISCONTINUED] atorvastatin (LIPITOR) 40 MG tablet Take 1 tablet (40 mg total) by mouth daily.   No facility-administered encounter medications on file as of 12/29/2016.     Allergies (verified) Patient has no known allergies.   History: Past Medical History:  Diagnosis Date  . Arthritis   . Diabetes ([place])   . Hematuria   . HLD (hyperlipidemia)   . Umbilical hernia    Past Surgical History:  Procedure Laterality Date  . CATARACT EXTRACTION Bilateral 2003  . CYSTOSCOPY W/ RETROGRADES Bilateral 02/26/2015   Procedure: CYSTOSCOPY WITH RETROGRADE PYELOGRAM;  Surgeon: Hollice Espy, MD;  Location: [facility] ORS;  Service: Urology;  Laterality: Bilateral;  . HERNIA REPAIR  2015   Family History  Problem Relation Age of Onset  . Prostate cancer Brother   . Kidney disease Neg Hx    Social History   Occupational History  . Not on file.   Social History Main Topics  . Smoking status: Former Smoker    Types: Cigarettes    Quit date: 02/23/1975  . Smokeless tobacco: Current User    Types: Chew     Comment: smoked for only 6 mos out of his whole life  . Alcohol use No  . Drug use:  No  . Sexual activity: Not Currently   Tobacco Counseling Ready to quit: Not Answered Counseling given: Not Answered   Activities of Daily Living In your present state of health, do you have any difficulty performing the following activities: 12/29/2016  Hearing? Y  Vision? N  Difficulty concentrating or making decisions? N  Walking or climbing stairs? N  Dressing or bathing? N  Doing errands, shopping? N  Preparing Food and eating ? N  Using the Toilet? N  In the past six months, have you accidently leaked urine? N  Do you have problems with loss of bowel control? N  Managing your Medications? N  Managing your Finances? N  Housekeeping or managing your Housekeeping? N  Some recent data might be hidden    Immunizations and Health Maintenance Immunization History  Administered Date(s) Administered  . Influenza,inj,Quad PF,36+ Mos 07/03/2016  . Influenza-Unspecified 05/22/2014  . Pneumococcal Conjugate-13 11/13/2014  . Pneumococcal Polysaccharide-23 12/29/2016  . Tdap 08/27/2015  . Zoster 07/28/2013   There are no preventive care reminders to display for this patient.  Patient Care Team: Juline Patch, MD as PCP - General (Family Medicine)  Indicate any recent Medical Services you may have received from other than [facility] providers in the past year (date may be approximate).    Assessment:   This is a routine wellness examination for Troy Rodriguez.  Hearing/Vision screen Vision Screening Comments: Goes to [employer] annually   Dietary issues and exercise activities discussed: Current Exercise Habits: Home exercise routine, Type of exercise: walking, Time (Minutes): 25, Frequency (Times/Week): 7, Weekly Exercise (Minutes/Week): 175, Intensity: Mild  Goals    . Increase water intake          Recommend drinking 4-5 glasses of water a day      Depression Screen PHQ 2/9 Scores 12/29/2016 08/27/2015 08/08/2015  PHQ - 2 Score 0 0 0    Fall Risk Fall Risk  12/29/2016  08/27/2015 08/08/2015  Falls in the past year? No No No    Cognitive Function:     6CIT Screen 12/29/2016  What Year? 0 points  What month? 0 points  What time? 0 points  Count back from 20 0 points  Months in reverse 0 points  Repeat phrase 0 points  Total Score 0    Screening Tests Health Maintenance  Topic Date Due  . FOOT EXAM  01/01/2017 (Originally 08/07/2016)  . URINE MICROALBUMIN  01/01/2017 (Originally 02/24/1951)  . HEMOGLOBIN A1C  01/01/2017  . INFLUENZA VACCINE  02/25/2017  . OPHTHALMOLOGY EXAM  03/28/2017  . TETANUS/TDAP  08/26/2025  . PNA vac Low Risk Adult  Completed        Plan:    I have personally reviewed and addressed the Medicare Annual Wellness questionnaire and have noted the following in the patient's chart:  A. Medical and social history B. Use of alcohol, tobacco or illicit drugs  C. Current medications and supplements D. Functional ability and status E.  Nutritional status F.  Physical activity G. Advance directives H. List of other physicians I.  Hospitalizations, surgeries, and ER visits in previous 12 months J.  [place] such as hearing and vision if needed, cognitive and depression L. Referrals and appointments - 01/01/2017 at 8:15am with Dr.Jones  In addition, I have reviewed and discussed with patient certain preventive protocols, quality metrics, and best practice recommendations. A written personalized care plan for preventive services as well as general preventive health recommendations were provided to patient.   Signed,  Tyler Aas, LPN Nurse Health Advisor   MD Recommendations:  Needs diabetic foot exam

## 2017-01-01 ENCOUNTER — Ambulatory Visit (INDEPENDENT_AMBULATORY_CARE_PROVIDER_SITE_OTHER): Payer: PPO | Admitting: Family Medicine

## 2017-01-01 ENCOUNTER — Encounter: Payer: Self-pay | Admitting: Family Medicine

## 2017-01-01 VITALS — BP 110/62 | HR 60 | Ht 73.0 in | Wt 283.0 lb

## 2017-01-01 DIAGNOSIS — E786 Lipoprotein deficiency: Secondary | ICD-10-CM | POA: Insufficient documentation

## 2017-01-01 DIAGNOSIS — E782 Mixed hyperlipidemia: Secondary | ICD-10-CM

## 2017-01-01 DIAGNOSIS — E669 Obesity, unspecified: Secondary | ICD-10-CM | POA: Diagnosis not present

## 2017-01-01 DIAGNOSIS — E118 Type 2 diabetes mellitus with unspecified complications: Secondary | ICD-10-CM | POA: Diagnosis not present

## 2017-01-01 DIAGNOSIS — E7849 Other hyperlipidemia: Secondary | ICD-10-CM

## 2017-01-01 DIAGNOSIS — E784 Other hyperlipidemia: Secondary | ICD-10-CM

## 2017-01-01 MED ORDER — ATORVASTATIN CALCIUM 40 MG PO TABS: 40.0000 mg | ORAL_TABLET | Freq: Every day | ORAL | 5 refills | Status: DC

## 2017-01-01 NOTE — Progress Notes (Signed)
Name: Troy Rodriguez   MRN: [id number]    DOB: 1941-06-12   Date:01/01/2017       Progress Note  Subjective  Chief Complaint  Chief Complaint  Patient presents with  . Hyperlipidemia  . prediabetes    diet controlled- needs A1C    Hyperlipidemia  This is a chronic problem. The current episode started more than 1 year ago. The problem is controlled. Recent lipid tests were reviewed and are variable. Exacerbating diseases include obesity. He has no history of chronic renal disease, diabetes, hypothyroidism, liver disease or nephrotic syndrome. Factors aggravating his hyperlipidemia include fatty foods. Pertinent negatives include no chest pain, focal sensory loss, focal weakness, leg pain, myalgias or shortness of breath. Current antihyperlipidemic treatment includes statins (omega 3). The current treatment provides moderate improvement of lipids. There are no compliance problems.  Risk factors for coronary artery disease include male sex, dyslipidemia and obesity.  Diabetes  He presents for his follow-up diabetic visit. His disease course has been fluctuating. Pertinent negatives for hypoglycemia include no confusion, dizziness, headaches, hunger, mood changes, nervousness/anxiousness, pallor, seizures, sleepiness, speech difficulty, sweats or tremors. Pertinent negatives for diabetes include no blurred vision, no chest pain, no foot paresthesias, no foot ulcerations, no polydipsia, no polyphagia, no polyuria and no weight loss. Symptoms are stable. Risk factors for coronary artery disease include dyslipidemia and obesity. Current diabetic treatment includes diet. His weight is stable. He is following a generally healthy diet. His breakfast blood glucose is taken between 8-9 am. His breakfast blood glucose range is generally 130-140 mg/dl. An ACE inhibitor/angiotensin II receptor blocker is not being taken. He does not see a podiatrist.Eye exam is not current.    No problem-specific Assessment & Plan  notes found for this encounter.   Past Medical History:  Diagnosis Date  . Arthritis   . Diabetes ([place])   . Hematuria   . HLD (hyperlipidemia)   . Umbilical hernia     Past Surgical History:  Procedure Laterality Date  . CATARACT EXTRACTION Bilateral 2003  . CYSTOSCOPY W/ RETROGRADES Bilateral 02/26/2015   Procedure: CYSTOSCOPY WITH RETROGRADE PYELOGRAM;  Surgeon: Hollice Espy, MD;  Location: [facility] ORS;  Service: Urology;  Laterality: Bilateral;  . HERNIA REPAIR  2015    Family History  Problem Relation Age of Onset  . Prostate cancer Brother   . Kidney disease Neg Hx     Social History   Social History  . Marital status: Married    Spouse name: N/A  . Number of children: N/A  . Years of education: N/A   Occupational History  . Not on file.   Social History Main Topics  . Smoking status: Former Smoker    Types: Cigarettes    Quit date: 02/23/1975  . Smokeless tobacco: Current User    Types: Chew     Comment: smoked for only 6 mos out of his whole life  . Alcohol use No  . Drug use: No  . Sexual activity: Not Currently   Other Topics Concern  . Not on file   Social History Narrative   Caffeine use:2 per day.    No Known Allergies  Outpatient Medications Prior to Visit  Medication Sig Dispense Refill  . aspirin 81 MG tablet Take 1 tablet by mouth daily.    . Omega-3 Fatty Acids (FISH OIL PO) Take 1 capsule by mouth daily. Reported on 08/08/2015    . atorvastatin (LIPITOR) 40 MG tablet TAKE 1 TABLET (40 MG TOTAL) BY MOUTH  DAILY. 30 tablet 5   No facility-administered medications prior to visit.     Review of Systems  Constitutional: Negative for chills, fever, malaise/fatigue and weight loss.  HENT: Negative for ear discharge, ear pain and sore throat.   Eyes: Negative for blurred vision.  Respiratory: Negative for cough, sputum production, shortness of breath and wheezing.   Cardiovascular: Negative for chest pain, palpitations and leg swelling.   Gastrointestinal: Negative for abdominal pain, blood in stool, constipation, diarrhea, heartburn, melena and nausea.  Genitourinary: Negative for dysuria, frequency, hematuria and urgency.  Musculoskeletal: Negative for back pain, joint pain, myalgias and neck pain.  Skin: Negative for pallor and rash.  Neurological: Negative for dizziness, tingling, tremors, sensory change, focal weakness, seizures, speech difficulty and headaches.  Endo/Heme/Allergies: Negative for environmental allergies, polydipsia and polyphagia. Does not bruise/bleed easily.  Psychiatric/Behavioral: Negative for confusion, depression and suicidal ideas. The patient is not nervous/anxious and does not have insomnia.      Objective  Vitals:   01/01/17 0821  BP: 110/62  Pulse: 60  Weight: 283 lb (128.4 kg)  Height: 6\' 1"  (1.854 m)    Physical Exam  Constitutional: He is oriented to person, place, and time and well-developed, well-nourished, and in no distress.  HENT:  Head: Normocephalic.  Right Ear: Tympanic membrane, external ear and ear canal normal.  Left Ear: Tympanic membrane, external ear and ear canal normal.  Nose: Nose normal.  Mouth/Throat: Oropharynx is clear and moist.  Eyes: Conjunctivae and EOM are normal. Pupils are equal, round, and reactive to light. Right eye exhibits no discharge. Left eye exhibits no discharge. No scleral icterus.  Neck: Normal range of motion. Neck supple. No JVD present. No tracheal deviation present. No thyromegaly present.  Cardiovascular: Normal rate, regular rhythm, normal heart sounds and intact distal pulses.  Exam reveals no gallop and no friction rub.   No murmur heard. Pulmonary/Chest: Breath sounds normal. No respiratory distress. He has no wheezes. He has no rales.  Abdominal: Soft. Bowel sounds are normal. He exhibits no mass. There is no hepatosplenomegaly. There is no tenderness. There is no rebound, no guarding and no CVA tenderness.  Musculoskeletal:  Normal range of motion. He exhibits no edema or tenderness.  Lymphadenopathy:    He has no cervical adenopathy.  Neurological: He is alert and oriented to person, place, and time. He has normal sensation, normal strength and intact cranial nerves. No cranial nerve deficit.  Skin: Skin is warm. No rash noted.  Psychiatric: Mood and affect normal.  Nursing note and vitals reviewed.     Assessment & Plan  Problem List Items Addressed This Visit      Endocrine   Diabetes mellitus, type 2 ([place]) - Primary   Relevant Medications   atorvastatin (LIPITOR) 40 MG tablet   Other Relevant Orders   Renal Function Panel   Hemoglobin A1c   Microalbumin / creatinine urine ratio     Other   Familial multiple lipoprotein-type hyperlipidemia   Relevant Medications   atorvastatin (LIPITOR) 40 MG tablet   Other Relevant Orders   Lipid Profile   Alimentary obesity   Mixed hyperlipidemia   Relevant Medications   atorvastatin (LIPITOR) 40 MG tablet   Other Relevant Orders   Lipid Profile   HDL lipoprotein deficiency      Meds ordered this encounter  Medications  . atorvastatin (LIPITOR) 40 MG tablet    Sig: Take 1 tablet (40 mg total) by mouth daily.    Dispense:  30  tablet    Refill:  5      Dr. [facility]  01/01/17

## 2017-01-02 LAB — LIPID PANEL
CHOLESTEROL TOTAL: 143 mg/dL (ref 100–199)
Chol/HDL Ratio: 4.9 ratio (ref 0.0–5.0)
HDL: 29 mg/dL — AB (ref >39)
LDL Calculated: 81 mg/dL (ref 0–99)
TRIGLYCERIDES: 164 mg/dL — AB (ref 0–149)
VLDL Cholesterol Cal: 33 mg/dL (ref 5–40)

## 2017-01-02 LAB — RENAL FUNCTION PANEL
ALBUMIN: 4.4 g/dL (ref 3.5–4.8)
BUN/Creatinine Ratio: 13 (ref 10–24)
BUN: 15 mg/dL (ref 8–27)
CALCIUM: 9.2 mg/dL (ref 8.6–10.2)
CO2: 24 mmol/L (ref 18–29)
CREATININE: 1.14 mg/dL (ref 0.76–1.27)
Chloride: 104 mmol/L (ref 96–106)
GFR calc Af Amer: 71 mL/min/{1.73_m2} (ref >59)
GFR calc non Af Amer: 62 mL/min/{1.73_m2} (ref >59)
Glucose: 165 mg/dL — ABNORMAL HIGH (ref 65–99)
PHOSPHORUS: 2.2 mg/dL — AB (ref 2.5–4.5)
Potassium: 4.5 mmol/L (ref 3.5–5.2)
SODIUM: 142 mmol/L (ref 134–144)

## 2017-01-02 LAB — MICROALBUMIN / CREATININE URINE RATIO
CREATININE, UR: 149.3 mg/dL
Microalb/Creat Ratio: 24.2 mg/g creat (ref 0.0–30.0)
Microalbumin, Urine: 36.1 ug/mL

## 2017-01-02 LAB — HEMOGLOBIN A1C
ESTIMATED AVERAGE GLUCOSE: 154 mg/dL
HEMOGLOBIN A1C: 7 % — AB (ref 4.8–5.6)

## 2017-08-04 ENCOUNTER — Encounter: Payer: Self-pay | Admitting: Family Medicine

## 2017-08-04 ENCOUNTER — Ambulatory Visit (INDEPENDENT_AMBULATORY_CARE_PROVIDER_SITE_OTHER): Payer: PPO | Admitting: Family Medicine

## 2017-08-04 VITALS — BP 120/80 | HR 60 | Ht 73.0 in | Wt 280.0 lb

## 2017-08-04 DIAGNOSIS — R7303 Prediabetes: Secondary | ICD-10-CM | POA: Diagnosis not present

## 2017-08-04 DIAGNOSIS — E786 Lipoprotein deficiency: Secondary | ICD-10-CM | POA: Diagnosis not present

## 2017-08-04 DIAGNOSIS — Z23 Encounter for immunization: Secondary | ICD-10-CM | POA: Diagnosis not present

## 2017-08-04 DIAGNOSIS — E118 Type 2 diabetes mellitus with unspecified complications: Secondary | ICD-10-CM

## 2017-08-04 DIAGNOSIS — E782 Mixed hyperlipidemia: Secondary | ICD-10-CM

## 2017-08-04 MED ORDER — ASPIRIN 81 MG PO TABS: 81.0000 mg | ORAL_TABLET | Freq: Every day | ORAL | 11 refills | Status: DC

## 2017-08-04 MED ORDER — ATORVASTATIN CALCIUM 40 MG PO TABS: 40.0000 mg | ORAL_TABLET | Freq: Every day | ORAL | 1 refills | Status: DC

## 2017-08-04 NOTE — Patient Instructions (Signed)

## 2017-08-04 NOTE — Progress Notes (Signed)
Name: Troy Rodriguez   MRN: [id number]    DOB: 01-Oct-1940   Date:08/04/2017       Progress Note  Subjective  Chief Complaint  Chief Complaint  Patient presents with  . Hyperlipidemia  . prediabetes    went off Metformin approx 1 year ago- checks BS "every once in a while"    Hyperlipidemia  This is a chronic problem. The current episode started more than 1 year ago. The problem is controlled. Recent lipid tests were reviewed and are normal. Exacerbating diseases include diabetes and obesity. He has no history of chronic renal disease, hypothyroidism, liver disease or nephrotic syndrome. There are no known factors aggravating his hyperlipidemia. Pertinent negatives include no chest pain, focal sensory loss, focal weakness, leg pain, myalgias or shortness of breath. Current antihyperlipidemic treatment includes statins. The current treatment provides moderate improvement of lipids. There are no compliance problems.  Risk factors for coronary artery disease include diabetes mellitus, dyslipidemia, male sex and obesity.  Diabetes  He presents for his follow-up diabetic visit. He has type 2 diabetes mellitus. His disease course has been stable. There are no hypoglycemic associated symptoms. Pertinent negatives for hypoglycemia include no dizziness, headaches or nervousness/anxiousness. Pertinent negatives for diabetes include no blurred vision, no chest pain, no fatigue, no foot paresthesias, no foot ulcerations, no polydipsia, no polyphagia, no polyuria, no visual change, no weakness and no weight loss. There are no hypoglycemic complications. Symptoms are stable. There are no diabetic complications. Risk factors for coronary artery disease include diabetes mellitus, dyslipidemia, male sex and obesity. Current diabetic treatment includes diet. He is compliant with treatment some of the time. His breakfast blood glucose is taken between 8-9 am. His breakfast blood glucose range is generally 140-180 mg/dl.     No problem-specific Assessment & Plan notes found for this encounter.   Past Medical History:  Diagnosis Date  . Arthritis   . Diabetes ([place])   . Hematuria   . HLD (hyperlipidemia)   . Umbilical hernia     Past Surgical History:  Procedure Laterality Date  . CATARACT EXTRACTION Bilateral 2003  . CYSTOSCOPY W/ RETROGRADES Bilateral 02/26/2015   Procedure: CYSTOSCOPY WITH RETROGRADE PYELOGRAM;  Surgeon: Hollice Espy, MD;  Location: [facility] OR[facility];  Service: Urology;  Laterality: Bilateral;  . HERNIA REPAIR  2015    Family History  Problem Relation Age of Onset  . Prostate cancer Brother   . Kidney disease Neg Hx     Social History   Socioeconomic History  . Marital status: Married    Spouse name: Not on file  . Number of children: Not on file  . Years of education: Not on file  . Highest education level: Not on file  Social Needs  . Financial resource strain: Not on file  . Food insecurity - worry: Not on file  . Food insecurity - inability: Not on file  . Transportation needs - medical: Not on file  . Transportation needs - non-medical: Not on file  Occupational History  . Not on file  Tobacco Use  . Smoking status: Former Smoker    Types: Cigarettes    Last attempt to quit: 02/23/1975    Years since quitting: 42.4  . Smokeless tobacco: Current User    Types: Chew  . Tobacco comment: smoked for only 6 mos out of his whole life  Substance and Sexual Activity  . Alcohol use: No    Alcohol/week: 0.0 oz  . Drug use: No  . Sexual activity:  Not Currently  Other Topics Concern  . Not on file  Social History Narrative   Caffeine use:2 per day.    No Known Allergies  Outpatient Medications Prior to Visit  Medication Sig Dispense Refill  . Omega-3 Fatty Acids (FISH OIL PO) Take 1 capsule by mouth daily. Reported on 08/08/2015    . aspirin 81 MG tablet Take 1 tablet by mouth daily.    Marland Kitchen atorvastatin (LIPITOR) 40 MG tablet Take 1 tablet (40 mg total) by mouth  daily. 30 tablet 5   No facility-administered medications prior to visit.     Review of Systems  Constitutional: Negative for chills, fatigue, fever, malaise/fatigue and weight loss.  HENT: Negative for ear discharge, ear pain and sore throat.   Eyes: Negative for blurred vision.  Respiratory: Negative for cough, sputum production, shortness of breath and wheezing.   Cardiovascular: Negative for chest pain, palpitations and leg swelling.  Gastrointestinal: Negative for abdominal pain, blood in stool, constipation, diarrhea, heartburn, melena and nausea.  Genitourinary: Negative for dysuria, frequency, hematuria and urgency.  Musculoskeletal: Negative for back pain, joint pain, myalgias and neck pain.  Skin: Negative for rash.  Neurological: Negative for dizziness, tingling, sensory change, focal weakness, weakness and headaches.  Endo/Heme/Allergies: Negative for environmental allergies, polydipsia and polyphagia. Does not bruise/bleed easily.  Psychiatric/Behavioral: Negative for depression and suicidal ideas. The patient is not nervous/anxious and does not have insomnia.      Objective  Vitals:   08/04/17 0824  BP: 120/80  Pulse: 60  Weight: 280 lb (127 kg)  Height: 6\' 1"  (1.854 m)    Physical Exam  Constitutional: He is oriented to person, place, and time and well-developed, well-nourished, and in no distress.  HENT:  Head: Normocephalic.  Right Ear: External ear normal.  Left Ear: External ear normal.  Nose: Nose normal.  Mouth/Throat: Oropharynx is clear and moist.  Eyes: Conjunctivae and EOM are normal. Pupils are equal, round, and reactive to light. Right eye exhibits no discharge. Left eye exhibits no discharge. No scleral icterus.  Neck: Normal range of motion. Neck supple. No JVD present. No tracheal deviation present. No thyromegaly present.  Cardiovascular: Normal rate, regular rhythm, normal heart sounds and intact distal pulses. Exam reveals no gallop and no  friction rub.  No murmur heard. Pulmonary/Chest: Breath sounds normal. No respiratory distress. He has no wheezes. He has no rales.  Abdominal: Soft. Bowel sounds are normal. He exhibits no mass. There is no hepatosplenomegaly. There is no tenderness. There is no rebound, no guarding and no CVA tenderness.  Musculoskeletal: Normal range of motion. He exhibits no edema or tenderness.  Lymphadenopathy:    He has no cervical adenopathy.  Neurological: He is alert and oriented to person, place, and time. He has normal sensation, normal strength, normal reflexes and intact cranial nerves. No cranial nerve deficit.  Skin: Skin is warm. No rash noted.  Psychiatric: Mood and affect normal.  Nursing note and vitals reviewed.     Assessment & Plan  Problem List Items Addressed This Visit      Endocrine   Type 2 diabetes mellitus with complication, without long-term current use of insulin ([facility])   Relevant Medications   atorvastatin (LIPITOR) 40 MG tablet   aspirin 81 MG tablet     Other   Mixed hyperlipidemia   Relevant Medications   atorvastatin (LIPITOR) 40 MG tablet   aspirin 81 MG tablet   HDL lipoprotein deficiency    Other Visit Diagnoses  Prediabetes    -  Primary   Relevant Orders   Hemoglobin A1c   Renal function panel   Influenza vaccine needed       Relevant Orders   Flu vaccine HIGH DOSE PF (Completed)      Meds ordered this encounter  Medications  . atorvastatin (LIPITOR) 40 MG tablet    Sig: Take 1 tablet (40 mg total) by mouth daily.    Dispense:  90 tablet    Refill:  1  . aspirin 81 MG tablet    Sig: Take 1 tablet (81 mg total) by mouth daily.    Dispense:  30 tablet    Refill:  11      Dr. Imog[facility]  08/04/17

## 2017-08-05 LAB — RENAL FUNCTION PANEL
ALBUMIN: 4.2 g/dL (ref 3.5–4.8)
BUN/Creatinine Ratio: 14 (ref 10–24)
BUN: 16 mg/dL (ref 8–27)
CHLORIDE: 105 mmol/L (ref 96–106)
CO2: 22 mmol/L (ref 20–29)
Calcium: 8.8 mg/dL (ref 8.6–10.2)
Creatinine, Ser: 1.11 mg/dL (ref 0.76–1.27)
GFR calc Af Amer: 73 mL/min/{1.73_m2} (ref >59)
GFR calc non Af Amer: 63 mL/min/{1.73_m2} (ref >59)
GLUCOSE: 155 mg/dL — AB (ref 65–99)
PHOSPHORUS: 2.6 mg/dL (ref 2.5–4.5)
POTASSIUM: 4.3 mmol/L (ref 3.5–5.2)
Sodium: 144 mmol/L (ref 134–144)

## 2017-08-05 LAB — HEMOGLOBIN A1C
Est. average glucose Bld gHb Est-mCnc: 151 mg/dL
Hgb A1c MFr Bld: 6.9 % — ABNORMAL HIGH (ref 4.8–5.6)

## 2017-09-14 ENCOUNTER — Other Ambulatory Visit: Payer: Self-pay

## 2017-09-14 DIAGNOSIS — E782 Mixed hyperlipidemia: Secondary | ICD-10-CM

## 2017-09-14 MED ORDER — ATORVASTATIN CALCIUM 40 MG PO TABS: 40.0000 mg | ORAL_TABLET | Freq: Every day | ORAL | 1 refills | Status: DC

## 2017-12-10 ENCOUNTER — Telehealth: Payer: Self-pay | Admitting: Family Medicine

## 2017-12-10 NOTE — Telephone Encounter (Signed)
Called to schedule Medicare Annual Wellness Visit with Nurse Health Advisor. If patient returns call, please note: their last AWV was on 12/29/16 please schedule AWV with [facility] any date after December 29 2017  Thank you! For any questions please contact: Jill Alexanders [phone]  Skype [email or web address]

## 2017-12-25 NOTE — Telephone Encounter (Signed)
3rd attempt

## 2018-03-08 ENCOUNTER — Telehealth: Payer: Self-pay | Admitting: Family Medicine

## 2018-03-08 NOTE — Telephone Encounter (Signed)
Called to schedule Medicare Annual Wellness Visit with the Nurse Health Advisor.   If patient returns call, please note: their last AWV was on 6 /4 /18 please schedule AWV with NHA any date   Thank you! For any questions please contact: Jill Alexanders [phone] or Skype at: [email or web address]

## 2018-04-01 ENCOUNTER — Ambulatory Visit (INDEPENDENT_AMBULATORY_CARE_PROVIDER_SITE_OTHER): Payer: PPO | Admitting: Family Medicine

## 2018-04-01 ENCOUNTER — Encounter: Payer: Self-pay | Admitting: Family Medicine

## 2018-04-01 VITALS — BP 138/70 | HR 60 | Ht 73.0 in | Wt 281.0 lb

## 2018-04-01 DIAGNOSIS — R3121 Asymptomatic microscopic hematuria: Secondary | ICD-10-CM | POA: Diagnosis not present

## 2018-04-01 DIAGNOSIS — Z8639 Personal history of other endocrine, nutritional and metabolic disease: Secondary | ICD-10-CM | POA: Diagnosis not present

## 2018-04-01 DIAGNOSIS — E118 Type 2 diabetes mellitus with unspecified complications: Secondary | ICD-10-CM

## 2018-04-01 DIAGNOSIS — E7849 Other hyperlipidemia: Secondary | ICD-10-CM | POA: Diagnosis not present

## 2018-04-01 DIAGNOSIS — Z8679 Personal history of other diseases of the circulatory system: Secondary | ICD-10-CM | POA: Diagnosis not present

## 2018-04-01 DIAGNOSIS — Z23 Encounter for immunization: Secondary | ICD-10-CM | POA: Diagnosis not present

## 2018-04-01 MED ORDER — ATORVASTATIN CALCIUM 40 MG PO TABS: 40.0000 mg | ORAL_TABLET | Freq: Every day | ORAL | 1 refills | Status: DC

## 2018-04-01 NOTE — Assessment & Plan Note (Signed)
Chronic Controlled Continue atorvastatin 40 mg daily. Check lipid panel.

## 2018-04-01 NOTE — Progress Notes (Signed)
Name: Troy Rodriguez   MRN: [id number]    DOB: 1940-08-02   Date:04/01/2018       Progress Note  Subjective  Chief Complaint  Chief Complaint  Patient presents with  . Hyperlipidemia    has been off lipitor per doctor  . prediabetes    off metformin- told to stop it x 1 year ago    Hyperlipidemia  This is a chronic problem. The problem is controlled. Exacerbating diseases include diabetes and obesity. He has no history of chronic renal disease, hypothyroidism, liver disease or nephrotic syndrome. There are no known factors aggravating his hyperlipidemia. Pertinent negatives include no chest pain, focal sensory loss, focal weakness, leg pain, myalgias or shortness of breath. Current antihyperlipidemic treatment includes statins (omega 3). The current treatment provides moderate improvement of lipids. There are no compliance problems.  Risk factors for coronary artery disease include dyslipidemia.  Diabetes  He presents for his follow-up diabetic visit. He has type 2 diabetes mellitus. Pertinent negatives for hypoglycemia include no confusion, dizziness, headaches, hunger, mood changes, nervousness/anxiousness, pallor, seizures, sleepiness, speech difficulty, sweats or tremors. Pertinent negatives for diabetes include no blurred vision, no chest pain, no fatigue, no foot paresthesias, no foot ulcerations, no polydipsia, no polyphagia, no polyuria, no visual change, no weakness and no weight loss. There are no hypoglycemic complications. Symptoms are stable. Risk factors for coronary artery disease include diabetes mellitus and dyslipidemia. Current diabetic treatment includes diet. He is compliant with treatment some of the time. He is following a generally healthy diet. He participates in exercise daily (walking program.). His breakfast blood glucose range is generally 140-180 mg/dl. His lunch blood glucose is taken between 11-12 pm. His lunch blood glucose range is generally 140-180 mg/dl. An ACE  inhibitor/angiotensin II receptor blocker is not being taken.    Familial multiple lipoprotein-type hyperlipidemia Chronic Controlled Continue atorvastatin 40 mg daily. Check lipid panel.    Past Medical History:  Diagnosis Date  . Arthritis   . Diabetes ([place])   . Hematuria   . HLD (hyperlipidemia)   . Umbilical hernia     Past Surgical History:  Procedure Laterality Date  . CATARACT EXTRACTION Bilateral 2003  . CYSTOSCOPY W/ RETROGRADES Bilateral 02/26/2015   Procedure: CYSTOSCOPY WITH RETROGRADE PYELOGRAM;  Surgeon: Hollice Espy, MD;  Location: [facility] ORS;  Service: Urology;  Laterality: Bilateral;  . HERNIA REPAIR  2015    Family History  Problem Relation Age of Onset  . Prostate cancer Brother   . Kidney disease Neg Hx     Social History   Socioeconomic History  . Marital status: Married    Spouse name: Not on file  . Number of children: Not on file  . Years of education: Not on file  . Highest education level: Not on file  Occupational History  . Not on file  Social Needs  . Financial resource strain: Not on file  . Food insecurity:    Worry: Not on file    Inability: Not on file  . Transportation needs:    Medical: Not on file    Non-medical: Not on file  Tobacco Use  . Smoking status: Former Smoker    Types: Cigarettes    Last attempt to quit: 02/23/1975    Years since quitting: 43.1  . Smokeless tobacco: Current User    Types: Chew  . Tobacco comment: smoked for only 6 mos out of his whole life  Substance and Sexual Activity  . Alcohol use: No  Alcohol/week: 0.0 standard drinks  . Drug use: No  . Sexual activity: Not Currently  Lifestyle  . Physical activity:    Days per week: Not on file    Minutes per session: Not on file  . Stress: Not on file  Relationships  . Social connections:    Talks on phone: Not on file    Gets together: Not on file    Attends religious service: Not on file    Active member of club or organization: Not on file      Attends meetings of clubs or organizations: Not on file    Relationship status: Not on file  . Intimate partner violence:    Fear of current or ex partner: Not on file    Emotionally abused: Not on file    Physically abused: Not on file    Forced sexual activity: Not on file  Other Topics Concern  . Not on file  Social History Narrative   Caffeine use:2 per day.    No Known Allergies  Outpatient Medications Prior to Visit  Medication Sig Dispense Refill  . aspirin 81 MG tablet Take 1 tablet (81 mg total) by mouth daily. 30 tablet 11  . Omega-3 Fatty Acids (FISH OIL PO) Take 1 capsule by mouth daily. Reported on 08/08/2015    . atorvastatin (LIPITOR) 40 MG tablet Take 1 tablet (40 mg total) by mouth daily. (Patient not taking: Reported on 04/01/2018) 90 tablet 1   No facility-administered medications prior to visit.     Review of Systems  Constitutional: Negative for chills, fatigue, fever, malaise/fatigue and weight loss.  HENT: Negative for ear discharge, ear pain and sore throat.   Eyes: Negative for blurred vision.  Respiratory: Negative for cough, sputum production, shortness of breath and wheezing.   Cardiovascular: Negative for chest pain, palpitations and leg swelling.  Gastrointestinal: Negative for abdominal pain, blood in stool, constipation, diarrhea, heartburn, melena and nausea.  Genitourinary: Negative for dysuria, frequency, hematuria and urgency.  Musculoskeletal: Negative for back pain, joint pain, myalgias and neck pain.  Skin: Negative for pallor and rash.  Neurological: Negative for dizziness, tingling, tremors, sensory change, focal weakness, seizures, speech difficulty, weakness and headaches.  Endo/Heme/Allergies: Negative for environmental allergies, polydipsia and polyphagia. Does not bruise/bleed easily.  Psychiatric/Behavioral: Negative for confusion, depression and suicidal ideas. The patient is not nervous/anxious and does not have insomnia.       Objective  Vitals:   04/01/18 0830  BP: 138/70  Pulse: 60  Weight: 281 lb (127.5 kg)  Height: 6\' 1"  (1.854 m)    Physical Exam  Constitutional: He is oriented to person, place, and time.  HENT:  Head: Normocephalic.  Right Ear: Hearing, tympanic membrane, external ear and ear canal normal.  Left Ear: Hearing, tympanic membrane, external ear and ear canal normal.  Nose: Nose normal.  Mouth/Throat: Uvula is midline and oropharynx is clear and moist. No oropharyngeal exudate, posterior oropharyngeal edema or posterior oropharyngeal erythema.  Eyes: Pupils are equal, round, and reactive to light. Conjunctivae and EOM are normal. Right eye exhibits no discharge. Left eye exhibits no discharge. No scleral icterus.  Neck: Normal range of motion. Neck supple. No JVD present. No tracheal deviation present. No thyromegaly present.  Cardiovascular: Normal rate, regular rhythm, S1 normal, S2 normal, normal heart sounds and intact distal pulses. Exam reveals no gallop, no S3, no S4, no distant heart sounds and no friction rub.  No murmur heard. Pulses:      Carotid  pulses are 2+ on the right side, and 2+ on the left side.      Radial pulses are 2+ on the right side, and 2+ on the left side.       Femoral pulses are 2+ on the right side, and 2+ on the left side.      Popliteal pulses are 2+ on the right side, and 2+ on the left side.       Dorsalis pedis pulses are 2+ on the right side, and 2+ on the left side.       Posterior tibial pulses are 2+ on the right side, and 2+ on the left side.  Pulmonary/Chest: Breath sounds normal. No respiratory distress. He has no wheezes. He has no rales.  Abdominal: Soft. Bowel sounds are normal. He exhibits no mass. There is no hepatosplenomegaly. There is no tenderness. There is no rebound, no guarding and no CVA tenderness.  Musculoskeletal: Normal range of motion. He exhibits no edema or tenderness.  Feet:  Right Foot:  Protective Sensation: 10  sites tested. 10 sites sensed.  Skin Integrity: Positive for callus and dry skin. Negative for ulcer, blister, skin breakdown, erythema or warmth.  Left Foot:  Protective Sensation: 10 sites tested. 10 sites sensed.  Skin Integrity: Positive for callus and dry skin. Negative for ulcer, blister, skin breakdown, erythema or warmth.  Lymphadenopathy:    He has no cervical adenopathy.  Neurological: He is alert and oriented to person, place, and time. He has normal strength and normal reflexes. No cranial nerve deficit.  Skin: Skin is warm, dry and intact. No rash noted.     Nursing note and vitals reviewed.     Assessment & Plan  Problem List Items Addressed This Visit      Endocrine   Type 2 diabetes mellitus with complication, without long-term current use of insulin ([place]) - Primary   Relevant Medications   atorvastatin (LIPITOR) 40 MG tablet   Other Relevant Orders   Microalbumin / creatinine urine ratio   Renal Function Panel   Hemoglobin A1c     Other   Blood in the urine   Relevant Orders   Urinalysis, Routine w reflex microscopic   Familial multiple lipoprotein-type hyperlipidemia    Chronic Controlled Continue atorvastatin 40 mg daily. Check lipid panel.       Relevant Medications   atorvastatin (LIPITOR) 40 MG tablet   Other Relevant Orders   Lipid panel    Other Visit Diagnoses    Influenza vaccine needed       Discussed and administered   Relevant Orders   Flu vaccine HIGH DOSE PF (Completed)   Hx of essential hypertension       Upper limits normal, check a1c and consider lisinopril if microalb positive.      Meds ordered this encounter  Medications  . atorvastatin (LIPITOR) 40 MG tablet    Sig: Take 1 tablet (40 mg total) by mouth daily.    Dispense:  90 tablet    Refill:  1      Dr. Otilio Miu [facility]  04/01/18

## 2018-04-02 LAB — LIPID PANEL
CHOL/HDL RATIO: 4.9 ratio (ref 0.0–5.0)
Cholesterol, Total: 138 mg/dL (ref 100–199)
HDL: 28 mg/dL — AB (ref >39)
LDL CALC: 84 mg/dL (ref 0–99)
Triglycerides: 130 mg/dL (ref 0–149)
VLDL Cholesterol Cal: 26 mg/dL (ref 5–40)

## 2018-04-02 LAB — MICROSCOPIC EXAMINATION: CASTS: NONE SEEN /LPF

## 2018-04-02 LAB — RENAL FUNCTION PANEL
ALBUMIN: 4.1 g/dL (ref 3.5–4.8)
BUN/Creatinine Ratio: 14 (ref 10–24)
BUN: 15 mg/dL (ref 8–27)
CO2: 22 mmol/L (ref 20–29)
Calcium: 8.9 mg/dL (ref 8.6–10.2)
Chloride: 103 mmol/L (ref 96–106)
Creatinine, Ser: 1.11 mg/dL (ref 0.76–1.27)
GFR calc Af Amer: 73 mL/min/{1.73_m2} (ref >59)
GFR, EST NON AFRICAN AMERICAN: 63 mL/min/{1.73_m2} (ref >59)
GLUCOSE: 180 mg/dL — AB (ref 65–99)
POTASSIUM: 4.8 mmol/L (ref 3.5–5.2)
Phosphorus: 2.4 mg/dL — ABNORMAL LOW (ref 2.5–4.5)
Sodium: 141 mmol/L (ref 134–144)

## 2018-04-02 LAB — URINALYSIS, ROUTINE W REFLEX MICROSCOPIC
BILIRUBIN UA: NEGATIVE
GLUCOSE, UA: NEGATIVE
KETONES UA: NEGATIVE
LEUKOCYTES UA: NEGATIVE
Nitrite, UA: NEGATIVE
PROTEIN UA: NEGATIVE
Specific Gravity, UA: 1.023 (ref 1.005–1.030)
Urobilinogen, Ur: 0.2 mg/dL (ref 0.2–1.0)
pH, UA: 5 (ref 5.0–7.5)

## 2018-04-02 LAB — MICROALBUMIN / CREATININE URINE RATIO
Creatinine, Urine: 168.4 mg/dL
Microalb/Creat Ratio: 3.3 mg/g creat (ref 0.0–30.0)
Microalbumin, Urine: 5.6 ug/mL

## 2018-04-02 LAB — HEMOGLOBIN A1C
Est. average glucose Bld gHb Est-mCnc: 169 mg/dL
HEMOGLOBIN A1C: 7.5 % — AB (ref 4.8–5.6)

## 2018-07-05 ENCOUNTER — Other Ambulatory Visit: Payer: Self-pay | Admitting: Family Medicine

## 2018-07-27 ENCOUNTER — Ambulatory Visit (INDEPENDENT_AMBULATORY_CARE_PROVIDER_SITE_OTHER): Payer: PPO | Admitting: Family Medicine

## 2018-07-27 ENCOUNTER — Encounter: Payer: Self-pay | Admitting: Family Medicine

## 2018-07-27 VITALS — BP 128/62 | HR 60 | Ht 73.0 in | Wt 277.0 lb

## 2018-07-27 DIAGNOSIS — E118 Type 2 diabetes mellitus with unspecified complications: Secondary | ICD-10-CM

## 2018-07-27 DIAGNOSIS — E7849 Other hyperlipidemia: Secondary | ICD-10-CM

## 2018-07-27 MED ORDER — ATORVASTATIN CALCIUM 40 MG PO TABS: 40.0000 mg | ORAL_TABLET | Freq: Every day | ORAL | 1 refills | Status: DC

## 2018-07-27 NOTE — Progress Notes (Signed)
Date:  07/27/2018   Name:  Troy Rodriguez   DOB:  1941/07/02   MRN:  [id number]   Chief Complaint: Diabetes (repeat A1C)  Diabetes  He presents for his follow-up diabetic visit. He has type 2 diabetes mellitus. His disease course has been stable. There are no hypoglycemic associated symptoms. Pertinent negatives for hypoglycemia include no dizziness, headaches or nervousness/anxiousness. Pertinent negatives for diabetes include no blurred vision, no chest pain, no fatigue, no foot paresthesias, no foot ulcerations, no polydipsia, no polyphagia, no polyuria, no visual change, no weakness and no weight loss. There are no hypoglycemic complications. Symptoms are stable. There are no diabetic complications. There are no known risk factors for coronary artery disease. He is compliant with treatment all of the time.    Review of Systems  Constitutional: Negative for chills, fatigue, fever and weight loss.  HENT: Negative for drooling, ear discharge, ear pain and sore throat.   Eyes: Negative for blurred vision.  Respiratory: Negative for cough, shortness of breath and wheezing.   Cardiovascular: Negative for chest pain, palpitations and leg swelling.  Gastrointestinal: Negative for abdominal pain, blood in stool, constipation, diarrhea and nausea.  Endocrine: Negative for polydipsia, polyphagia and polyuria.  Genitourinary: Negative for dysuria, frequency, hematuria and urgency.  Musculoskeletal: Negative for back pain, myalgias and neck pain.  Skin: Negative for rash.  Allergic/Immunologic: Negative for environmental allergies.  Neurological: Negative for dizziness, weakness and headaches.  Hematological: Does not bruise/bleed easily.  Psychiatric/Behavioral: Negative for suicidal ideas. The patient is not nervous/anxious.     Patient Active Problem List   Diagnosis Date Noted  . Mixed hyperlipidemia 01/01/2017  . 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 H/O hypercholesterolemia  12/20/2014  . Flu vaccine need 12/20/2014  . Need for vaccination 12/20/2014  . Umbilical hernia without obstruction and without gangrene 12/20/2014  . Familial multiple lipoprotein-type hyperlipidemia 12/08/2014  . Alimentary obesity 12/08/2014  . Type 2 diabetes mellitus with complication, without long-term current use of insulin ([place]) 12/08/2014  . Essential (primary) hypertension 12/08/2014  . Blood in the urine 12/08/2014  . Routine general medical examination at a health care facility 12/08/2014  . Screening for depression 12/08/2014    No Known Allergies  Past Surgical History:  Procedure Laterality Date  . CATARACT EXTRACTION Bilateral 2003  . CYSTOSCOPY W/ RETROGRADES Bilateral 02/26/2015   Procedure: CYSTOSCOPY WITH RETROGRADE PYELOGRAM;  Surgeon: Hollice Espy, MD;  Location: [facility] ORS;  Service: Urology;  Laterality: Bilateral;  . HERNIA REPAIR  2015    Social History   Tobacco Use  . Smoking status: Former Smoker    Types: Cigarettes    Last attempt to quit: 02/23/1975    Years since quitting: 43.4  . Smokeless tobacco: Current User    Types: Chew  . Tobacco comment: smoked for only 6 mos out of his whole life  Substance Use Topics  . Alcohol use: No    Alcohol/week: 0.0 standard drinks  . Drug use: No     Medication list has been reviewed and updated.  Current Meds  Medication Sig  . aspirin 81 MG EC tablet TAKE 1 TABLET BY MOUTH EVERY DAY  . atorvastatin (LIPITOR) 40 MG tablet Take 1 tablet (40 mg total) by mouth daily.  . Omega-3 Fatty Acids (FISH OIL PO) Take 1 capsule by mouth daily. Reported on 08/08/2015    PHQ 2/9 Scores 04/01/2018 04/01/2018 12/29/2016 08/27/2015  PHQ - 2 Score 0 0 0 0  PHQ- 9 Score 0 - - -  Physical Exam Vitals signs and nursing note reviewed.  HENT:     Head: Normocephalic.     Right Ear: External ear normal.     Left Ear: External ear normal.     Nose: Nose normal.  Eyes:     General: No scleral icterus.       Right eye:  No discharge.        Left eye: No discharge.     Conjunctiva/sclera: Conjunctivae normal.     Pupils: Pupils are equal, round, and reactive to light.  Neck:     Musculoskeletal: Normal range of motion and neck supple.     Thyroid: No thyromegaly.     Vascular: No JVD.     Trachea: No tracheal deviation.  Cardiovascular:     Rate and Rhythm: Normal rate and regular rhythm.     Heart sounds: Normal heart sounds. No murmur. No friction rub. No gallop.   Pulmonary:     Effort: No respiratory distress.     Breath sounds: Normal breath sounds. Stridor: a1c. No wheezing or rales.  Abdominal:     General: Bowel sounds are normal.     Palpations: Abdomen is soft. There is no mass.     Tenderness: There is no abdominal tenderness. There is no guarding or rebound.  Musculoskeletal: Normal range of motion.        General: No tenderness.  Lymphadenopathy:     Cervical: No cervical adenopathy.  Skin:    General: Skin is warm.     Findings: No rash.  Neurological:     Mental Status: He is alert and oriented to person, place, and time.     Cranial Nerves: No cranial nerve deficit.     Deep Tendon Reflexes: Reflexes are normal and symmetric.     BP 128/62   Pulse 60   Ht 6\' 1"  (1.854 m)   Wt 277 lb (125.6 kg)   BMI 36.55 kg/m   Assessment and Plan: 1. Familial multiple lipoprotein-type hyperlipidemia Chronic. Controlled on med- refill atorvastatin - atorvastatin (LIPITOR) 40 MG tablet; Take 1 tablet (40 mg total) by mouth daily.  Dispense: 90 tablet; Refill: 1  2. Type 2 diabetes mellitus with complication, without long-term current use of insulin (HCC) Chronic. Last A1C was elevated. Recheck glucose and A1C after patient has tried to monitor diet. - Hemoglobin A1c - Glucose

## 2018-07-28 LAB — GLUCOSE, RANDOM: Glucose: 157 mg/dL — ABNORMAL HIGH (ref 65–99)

## 2018-07-28 LAB — HEMOGLOBIN A1C
Est. average glucose Bld gHb Est-mCnc: 169 mg/dL
Hgb A1c MFr Bld: 7.5 % — ABNORMAL HIGH (ref 4.8–5.6)

## 2018-07-30 ENCOUNTER — Other Ambulatory Visit: Payer: Self-pay

## 2018-07-30 DIAGNOSIS — E118 Type 2 diabetes mellitus with unspecified complications: Secondary | ICD-10-CM

## 2018-07-30 MED ORDER — METFORMIN HCL 500 MG PO TABS: 500.0000 mg | ORAL_TABLET | Freq: Two times a day (BID) | ORAL | 1 refills | Status: DC

## 2018-08-24 ENCOUNTER — Other Ambulatory Visit: Payer: Self-pay | Admitting: Family Medicine

## 2018-08-24 DIAGNOSIS — E118 Type 2 diabetes mellitus with unspecified complications: Secondary | ICD-10-CM

## 2018-09-18 ENCOUNTER — Other Ambulatory Visit: Payer: Self-pay | Admitting: Family Medicine

## 2018-09-18 DIAGNOSIS — E118 Type 2 diabetes mellitus with unspecified complications: Secondary | ICD-10-CM

## 2018-09-24 ENCOUNTER — Telehealth: Payer: Self-pay | Admitting: Family Medicine

## 2018-09-24 NOTE — Telephone Encounter (Signed)
Called to schedule Medicare Annual Wellness Visit with the Nurse Health Advisor. No answer, left message on machine.  If patient returns call, please note: their last AWV was on 12/29/2016, please schedule AWV with [facility] any date AFTER 12/29/2017.  Thank you! For any questions please contact: Janace Hoard at [phone] or Skype [email or web address]

## 2018-10-05 ENCOUNTER — Other Ambulatory Visit: Payer: Self-pay | Admitting: Family Medicine

## 2018-10-13 ENCOUNTER — Other Ambulatory Visit: Payer: Self-pay | Admitting: Family Medicine

## 2018-10-13 DIAGNOSIS — E118 Type 2 diabetes mellitus with unspecified complications: Secondary | ICD-10-CM

## 2018-11-14 ENCOUNTER — Other Ambulatory Visit: Payer: Self-pay | Admitting: Family Medicine

## 2018-11-14 DIAGNOSIS — E118 Type 2 diabetes mellitus with unspecified complications: Secondary | ICD-10-CM

## 2018-11-16 ENCOUNTER — Ambulatory Visit (INDEPENDENT_AMBULATORY_CARE_PROVIDER_SITE_OTHER): Payer: PPO | Admitting: Family Medicine

## 2018-11-16 ENCOUNTER — Encounter: Payer: Self-pay | Admitting: Family Medicine

## 2018-11-16 VITALS — Ht 73.0 in | Wt 277.0 lb

## 2018-11-16 DIAGNOSIS — E118 Type 2 diabetes mellitus with unspecified complications: Secondary | ICD-10-CM | POA: Diagnosis not present

## 2018-11-16 MED ORDER — METFORMIN HCL 500 MG PO TABS: 500.0000 mg | ORAL_TABLET | Freq: Two times a day (BID) | ORAL | 1 refills | Status: DC

## 2018-11-16 NOTE — Progress Notes (Addendum)
Date:  11/16/2018   Name:  Troy Rodriguez   DOB:  Oct 07, 1940   MRN:  [id number]   Chief Complaint: Follow-up (diabetes- started back on met)  I connected withthis patient, Troy Rodriguez, by telephoneat the patient's home.  I verified that I am speaking with the correct person using two identifiers. This visit was conducted via telephone due to the Covid-19 outbreak from my office at [facility] in [place], [place]. I discussed the limitations, risks, security and privacy concerns of performing an evaluation and management service by telephone. I also discussed with the patient that there may be a patient responsible charge related to this service. The patient expressed understanding and agreed to proceed.  Diabetes  He presents for his follow-up diabetic visit. He has type 2 diabetes mellitus. His disease course has been stable. There are no hypoglycemic associated symptoms. Pertinent negatives for hypoglycemia include no confusion, dizziness, headaches, hunger, mood changes, nervousness/anxiousness, pallor, seizures, sleepiness, speech difficulty, sweats or tremors. Pertinent negatives for diabetes include no blurred vision, no chest pain, no fatigue, no foot paresthesias, no foot ulcerations, no polydipsia, no polyphagia, no polyuria, no visual change, no weakness and no weight loss. There are no hypoglycemic complications. Symptoms are stable. There are no diabetic complications. Current diabetic treatment includes oral agent (monotherapy) (presently adjusted to medication). His weight is stable. He is following a generally healthy diet. Meal planning includes avoidance of concentrated sweets and carbohydrate counting. His home blood glucose trend is fluctuating minimally. His breakfast blood glucose is taken between 8-9 am. His breakfast blood glucose range is generally 140-180 mg/dl. (160 mg %) Eye exam is not current.    Review of Systems  Constitutional: Negative for chills, fatigue,  fever and weight loss.  HENT: Negative for drooling, ear discharge, ear pain and sore throat.   Eyes: Negative for blurred vision.  Respiratory: Negative for cough, shortness of breath and wheezing.   Cardiovascular: Negative for chest pain, palpitations and leg swelling.  Gastrointestinal: Negative for abdominal pain, blood in stool, constipation, diarrhea and nausea.  Endocrine: Negative for polydipsia, polyphagia and polyuria.  Genitourinary: Negative for dysuria, frequency, hematuria and urgency.  Musculoskeletal: Negative for back pain, myalgias and neck pain.  Skin: Negative for pallor and rash.  Allergic/Immunologic: Negative for environmental allergies.  Neurological: Negative for dizziness, tremors, seizures, speech difficulty, weakness and headaches.  Hematological: Does not bruise/bleed easily.  Psychiatric/Behavioral: Negative for confusion and suicidal ideas. The patient is not nervous/anxious.     Patient Active Problem List   Diagnosis Date Noted  . Mixed hyperlipidemia 01/01/2017  . 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 H/O hypercholesterolemia 12/20/2014  . Flu vaccine need 12/20/2014  . Need for vaccination 12/20/2014  . Umbilical hernia without obstruction and without gangrene 12/20/2014  . Familial multiple lipoprotein-type hyperlipidemia 12/08/2014  . Alimentary obesity 12/08/2014  . Type 2 diabetes mellitus with complication, without long-term current use of insulin ([place]) 12/08/2014  . Essential (primary) hypertension 12/08/2014  . Blood in the urine 12/08/2014  . Routine general medical examination at a health care facility 12/08/2014  . Screening for depression 12/08/2014    No Known Allergies  Past Surgical History:  Procedure Laterality Date  . CATARACT EXTRACTION Bilateral 2003  . CYSTOSCOPY W/ RETROGRADES Bilateral 02/26/2015   Procedure: CYSTOSCOPY WITH RETROGRADE PYELOGRAM;  Surgeon: Hollice Espy, MD;  Location: [facility] ORS;  Service: Urology;   Laterality: Bilateral;  . HERNIA REPAIR  2015    Social History   Tobacco  Use  . Smoking status: Former Smoker    Types: Cigarettes    Last attempt to quit: 02/23/1975    Years since quitting: 43.7  . Smokeless tobacco: Current User    Types: Chew  . Tobacco comment: smoked for only 6 mos out of his whole life  Substance Use Topics  . Alcohol use: No    Alcohol/week: 0.0 standard drinks  . Drug use: No     Medication list has been reviewed and updated.  Current Meds  Medication Sig  . aspirin 81 MG EC tablet TAKE 1 TABLET BY MOUTH EVERY DAY  . atorvastatin (LIPITOR) 40 MG tablet Take 1 tablet (40 mg total) by mouth daily.  . metFORMIN (GLUCOPHAGE) 500 MG tablet TAKE 1 TABLET (500 MG TOTAL) BY MOUTH 2 (TWO) TIMES DAILY WITH A MEAL.  [place] Kitchen Omega-3 Fatty Acids (FISH OIL PO) Take 1 capsule by mouth daily. Reported on 08/08/2015    PHQ 2/9 Scores 11/16/2018 04/01/2018 04/01/2018 12/29/2016  PHQ - 2 Score 0 0 0 0  PHQ- 9 Score 0 0 - -    BP Readings from Last 3 Encounters:  07/27/18 128/62  04/01/18 138/70  08/04/17 120/80    Physical Exam Vitals signs (limited) and nursing note reviewed.  Constitutional:      Appearance: He is obese.  Neurological:     Mental Status: He is alert.     Wt Readings from Last 3 Encounters:  11/16/18 277 lb (125.6 kg)  07/27/18 277 lb (125.6 kg)  04/01/18 281 lb (127.5 kg)    Ht _0  (1.854 m)   Wt 277 lb (125.6 kg)   BMI 36.55 kg/m   Assessment and Plan:  1. Type 2 diabetes mellitus with complication, without long-term current use of insulin (HCC) Chronic.  Uncertain of control with a recent re-prescribing of metformin 500 mg twice a day patient will have his hemoglobin A1c and renal function panel checked determine if this will be sufficient for his diabetic control.  Patient will be returning tomorrow for his blood drawn in a fasting setting. - Hemoglobin A1c - Renal Function Panel - metFORMIN (GLUCOPHAGE) 500 MG tablet; Take 1 tablet  (500 mg total) by mouth 2 (two) times daily with a meal.  Dispense: 180 tablet; Refill: 1  I spent 10 minutes with this patient, More than 50% of that time was spent in voice to voice education, counseling and care coordination.

## 2018-11-17 DIAGNOSIS — E118 Type 2 diabetes mellitus with unspecified complications: Secondary | ICD-10-CM | POA: Diagnosis not present

## 2018-11-18 LAB — HEMOGLOBIN A1C
Est. average glucose Bld gHb Est-mCnc: 157 mg/dL
Hgb A1c MFr Bld: 7.1 % — ABNORMAL HIGH (ref 4.8–5.6)

## 2018-11-18 LAB — RENAL FUNCTION PANEL
Albumin: 4 g/dL (ref 3.7–4.7)
BUN/Creatinine Ratio: 13 (ref 10–24)
BUN: 13 mg/dL (ref 8–27)
CO2: 23 mmol/L (ref 20–29)
Calcium: 8.8 mg/dL (ref 8.6–10.2)
Chloride: 106 mmol/L (ref 96–106)
Creatinine, Ser: 1.04 mg/dL (ref 0.76–1.27)
GFR calc Af Amer: 79 mL/min/{1.73_m2} (ref >59)
GFR calc non Af Amer: 68 mL/min/{1.73_m2} (ref >59)
Glucose: 147 mg/dL — ABNORMAL HIGH (ref 65–99)
Phosphorus: 3 mg/dL (ref 2.8–4.1)
Potassium: 4.3 mmol/L (ref 3.5–5.2)
Sodium: 144 mmol/L (ref 134–144)

## 2018-12-29 ENCOUNTER — Other Ambulatory Visit: Payer: Self-pay | Admitting: Family Medicine

## 2019-03-26 ENCOUNTER — Other Ambulatory Visit: Payer: Self-pay | Admitting: Family Medicine

## 2019-04-09 ENCOUNTER — Other Ambulatory Visit: Payer: Self-pay | Admitting: Family Medicine

## 2019-04-09 DIAGNOSIS — E7849 Other hyperlipidemia: Secondary | ICD-10-CM

## 2019-05-31 ENCOUNTER — Encounter: Payer: Self-pay | Admitting: Family Medicine

## 2019-05-31 ENCOUNTER — Ambulatory Visit (INDEPENDENT_AMBULATORY_CARE_PROVIDER_SITE_OTHER): Payer: PPO | Admitting: Family Medicine

## 2019-05-31 ENCOUNTER — Other Ambulatory Visit: Payer: Self-pay

## 2019-05-31 VITALS — BP 130/64 | HR 60 | Ht 73.0 in | Wt 279.0 lb

## 2019-05-31 DIAGNOSIS — L989 Disorder of the skin and subcutaneous tissue, unspecified: Secondary | ICD-10-CM | POA: Diagnosis not present

## 2019-05-31 DIAGNOSIS — Z23 Encounter for immunization: Secondary | ICD-10-CM | POA: Diagnosis not present

## 2019-05-31 NOTE — Progress Notes (Signed)
Date:  05/31/2019   Name:  Troy Rodriguez   DOB:  April 23, 1941   MRN:  [id number]   Chief Complaint: spot on nose (place on L) side of nose that never heals- been there for 6 months- referral to derm) and infl vacc need  Patient is a 78 year old male who presents for a dermatologic concern exam. The patient reports the following problems:dark , scaly area left crease of nose.Marland Kitchen Health maintenance has been reviewed needs A1c   Review of Systems  Constitutional: Negative for chills and fever.  HENT: Negative for drooling, ear discharge, ear pain and sore throat.   Respiratory: Negative for cough, shortness of breath and wheezing.   Cardiovascular: Negative for chest pain, palpitations and leg swelling.  Gastrointestinal: Negative for abdominal pain, blood in stool, constipation, diarrhea and nausea.  Endocrine: Negative for polydipsia.  Genitourinary: Negative for dysuria, frequency, hematuria and urgency.  Musculoskeletal: Negative for back pain, myalgias and neck pain.  Skin: Negative for rash.  Allergic/Immunologic: Negative for environmental allergies.  Neurological: Negative for dizziness and headaches.  Hematological: Does not bruise/bleed easily.  Psychiatric/Behavioral: Negative for suicidal ideas. The patient is not nervous/anxious.     Patient Active Problem List   Diagnosis Date Noted  . Mixed hyperlipidemia 01/01/2017  . 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 H/O hypercholesterolemia 12/20/2014  . Flu vaccine need 12/20/2014  . Need for vaccination 12/20/2014  . Umbilical hernia without obstruction and without gangrene 12/20/2014  . Familial multiple lipoprotein-type hyperlipidemia 12/08/2014  . Alimentary obesity 12/08/2014  . Type 2 diabetes mellitus with complication, without long-term current use of insulin ([place]) 12/08/2014  . Essential (primary) hypertension 12/08/2014  . Blood in the urine 12/08/2014  . Routine general medical examination at a health care  facility 12/08/2014  . Screening for depression 12/08/2014    No Known Allergies  Past Surgical History:  Procedure Laterality Date  . CATARACT EXTRACTION Bilateral 2003  . CYSTOSCOPY W/ RETROGRADES Bilateral 02/26/2015   Procedure: CYSTOSCOPY WITH RETROGRADE PYELOGRAM;  Surgeon: Hollice Espy, MD;  Location: [facility] ORS;  Service: Urology;  Laterality: Bilateral;  . HERNIA REPAIR  2015    Social History   Tobacco Use  . Smoking status: Former Smoker    Types: Cigarettes    Quit date: 02/23/1975    Years since quitting: 44.2  . Smokeless tobacco: Current User    Types: Chew  . Tobacco comment: smoked for only 6 mos out of his whole life  Substance Use Topics  . Alcohol use: No    Alcohol/week: 0.0 standard drinks  . Drug use: No     Medication list has been reviewed and updated.  Current Meds  Medication Sig  . aspirin 81 MG EC tablet TAKE 1 TABLET BY MOUTH EVERY DAY  . atorvastatin (LIPITOR) 40 MG tablet TAKE 1 TABLET BY MOUTH EVERY DAY  . metFORMIN (GLUCOPHAGE) 500 MG tablet TAKE 1 TABLET (500 MG TOTAL) BY MOUTH 2 (TWO) TIMES DAILY WITH A MEAL.  Marland Kitchen Omega-3 Fatty Acids (FISH OIL PO) Take 1 capsule by mouth daily. Reported on 08/08/2015    PHQ 2/9 Scores 11/16/2018 04/01/2018 04/01/2018 12/29/2016  PHQ - 2 Score 0 0 0 0  PHQ- 9 Score 0 0 - -    BP Readings from Last 3 Encounters:  05/31/19 130/64  07/27/18 128/62  04/01/18 138/70    Physical Exam Vitals signs and nursing note reviewed.  HENT:     Head: Normocephalic.  Right Ear: Tympanic membrane, ear canal and external ear normal.     Left Ear: Tympanic membrane, ear canal and external ear normal.     Nose: Nose normal.  Eyes:     General: No scleral icterus.       Right eye: No discharge.        Left eye: No discharge.     Conjunctiva/sclera: Conjunctivae normal.     Pupils: Pupils are equal, round, and reactive to light.  Neck:     Musculoskeletal: Full passive range of motion without pain, normal range of  motion and neck supple.     Thyroid: No thyromegaly.     Vascular: No JVD.     Trachea: No tracheal deviation.  Cardiovascular:     Rate and Rhythm: Normal rate and regular rhythm.     Chest Wall: PMI is not displaced. No thrill.     Pulses: Normal pulses. No decreased pulses.          Carotid pulses are 2+ on the right side and 2+ on the left side.      Radial pulses are 2+ on the right side and 2+ on the left side.       Femoral pulses are 2+ on the right side and 2+ on the left side.      Popliteal pulses are 2+ on the right side and 2+ on the left side.       Dorsalis pedis pulses are 2+ on the right side and 2+ on the left side.       Posterior tibial pulses are 2+ on the right side and 2+ on the left side.     Heart sounds: Normal heart sounds, S1 normal and S2 normal. No murmur. No systolic murmur. No diastolic murmur. No friction rub. No gallop. No S3 or S4 sounds.   Pulmonary:     Effort: No respiratory distress.     Breath sounds: Normal breath sounds. No wheezing or rales.  Abdominal:     General: Bowel sounds are normal.     Palpations: Abdomen is soft. There is no mass.     Tenderness: There is no abdominal tenderness. There is no guarding or rebound.  Musculoskeletal: Normal range of motion.        General: No tenderness.     Right lower leg: No edema.     Left lower leg: No edema.  Lymphadenopathy:     Cervical: No cervical adenopathy.     Right cervical: No superficial or deep cervical adenopathy.    Left cervical: No superficial, deep or posterior cervical adenopathy.  Skin:    General: Skin is warm.     Capillary Refill: Capillary refill takes less than 2 seconds.     Findings: Rash present. Rash is macular and scaling.     Comments: Pitted/scaly within  Neurological:     Mental Status: He is alert and oriented to person, place, and time.     Cranial Nerves: No cranial nerve deficit.     Deep Tendon Reflexes: Reflexes are normal and symmetric.     Wt  Readings from Last 3 Encounters:  05/31/19 279 lb (126.6 kg)  11/16/18 277 lb (125.6 kg)  07/27/18 277 lb (125.6 kg)    BP 130/64   Pulse 60   Ht 6\' 1"  (1.854 m)   Wt 279 lb (126.6 kg)   BMI 36.81 kg/m   Assessment and Plan:  1. Skin lesion of face Patient has a  lesion of the left nasal crease which has a pitted area with scaling.  Although I doubt this is assess it needs to be evaluated and perhaps a biopsy.  Will refer to dermatology for evaluation and further treatment. - Ambulatory referral to Dermatology  2. Influenza vaccine needed Discussed and administered - Flu Vaccine QUAD High Dose(Fluad)

## 2019-06-01 DIAGNOSIS — C44311 Basal cell carcinoma of skin of nose: Secondary | ICD-10-CM | POA: Diagnosis not present

## 2019-06-01 DIAGNOSIS — L578 Other skin changes due to chronic exposure to nonionizing radiation: Secondary | ICD-10-CM | POA: Diagnosis not present

## 2019-06-01 DIAGNOSIS — D485 Neoplasm of uncertain behavior of skin: Secondary | ICD-10-CM | POA: Diagnosis not present

## 2019-06-30 DIAGNOSIS — C4491 Basal cell carcinoma of skin, unspecified: Secondary | ICD-10-CM | POA: Diagnosis not present

## 2019-06-30 DIAGNOSIS — C44311 Basal cell carcinoma of skin of nose: Secondary | ICD-10-CM | POA: Diagnosis not present

## 2019-07-12 ENCOUNTER — Encounter: Payer: Self-pay | Admitting: Family Medicine

## 2019-07-12 ENCOUNTER — Other Ambulatory Visit: Payer: Self-pay

## 2019-07-12 ENCOUNTER — Ambulatory Visit (INDEPENDENT_AMBULATORY_CARE_PROVIDER_SITE_OTHER): Payer: PPO | Admitting: Family Medicine

## 2019-07-12 VITALS — BP 138/70 | HR 68 | Ht 73.0 in | Wt 277.0 lb

## 2019-07-12 DIAGNOSIS — R351 Nocturia: Secondary | ICD-10-CM

## 2019-07-12 DIAGNOSIS — R69 Illness, unspecified: Secondary | ICD-10-CM | POA: Diagnosis not present

## 2019-07-12 DIAGNOSIS — E118 Type 2 diabetes mellitus with unspecified complications: Secondary | ICD-10-CM | POA: Diagnosis not present

## 2019-07-12 DIAGNOSIS — E782 Mixed hyperlipidemia: Secondary | ICD-10-CM

## 2019-07-12 DIAGNOSIS — I10 Essential (primary) hypertension: Secondary | ICD-10-CM

## 2019-07-12 DIAGNOSIS — E7849 Other hyperlipidemia: Secondary | ICD-10-CM

## 2019-07-12 LAB — POCT URINALYSIS DIPSTICK
Bilirubin, UA: NEGATIVE
Glucose, UA: NEGATIVE
Ketones, UA: NEGATIVE
Leukocytes, UA: NEGATIVE
Nitrite, UA: NEGATIVE
Protein, UA: NEGATIVE
Spec Grav, UA: 1.02 (ref 1.010–1.025)
Urobilinogen, UA: 0.2 E.U./dL
pH, UA: 6 (ref 5.0–8.0)

## 2019-07-12 MED ORDER — LISINOPRIL 5 MG PO TABS: 5.0000 mg | ORAL_TABLET | Freq: Every day | ORAL | 0 refills | Status: DC

## 2019-07-12 MED ORDER — PIOGLITAZONE HCL 15 MG PO TABS: 15.0000 mg | ORAL_TABLET | Freq: Every day | ORAL | 0 refills | Status: DC

## 2019-07-12 MED ORDER — ATORVASTATIN CALCIUM 40 MG PO TABS: 40.0000 mg | ORAL_TABLET | Freq: Every day | ORAL | 1 refills | Status: DC

## 2019-07-12 NOTE — Progress Notes (Signed)
Date:  07/12/2019   Name:  Troy Rodriguez   DOB:  Dec 20, 1940   MRN:  [id number]   Chief Complaint: Diabetes (running around 200- "actos worked better") and Hyperlipidemia  Diabetes He presents for his follow-up diabetic visit. He has type 2 diabetes mellitus. MedicAlert identification noted. His disease course has been stable. There are no hypoglycemic associated symptoms. Pertinent negatives for hypoglycemia include no dizziness, headaches or nervousness/anxiousness. Pertinent negatives for diabetes include no blurred vision, no chest pain, no fatigue, no foot paresthesias, no foot ulcerations, no polydipsia, no polyphagia, no polyuria, no visual change, no weakness and no weight loss. There are no hypoglycemic complications. Symptoms are stable. There are no diabetic complications. There are no known risk factors for coronary artery disease. Current diabetic treatment includes oral agent (monotherapy). His weight is stable. He is following a generally healthy diet. Meal planning includes avoidance of concentrated sweets and carbohydrate counting. He participates in exercise daily. There is no change in his home blood glucose trend. His breakfast blood glucose is taken between 8-9 am. His breakfast blood glucose range is generally >200 mg/dl. An ACE inhibitor/angiotensin II receptor blocker is being taken. He does not see a podiatrist.Eye exam is not current.  Hyperlipidemia This is a chronic problem. The problem is controlled. Recent lipid tests were reviewed and are normal. He has no history of chronic renal disease, diabetes, hypothyroidism, liver disease, obesity or nephrotic syndrome. There are no known factors aggravating his hyperlipidemia. Pertinent negatives include no chest pain, focal sensory loss, focal weakness, leg pain, myalgias or shortness of breath. Current antihyperlipidemic treatment includes statins. The current treatment provides moderate improvement of lipids. There are no  compliance problems.  Risk factors for coronary artery disease include diabetes mellitus and hypertension.    Lab Results  Component Value Date   CREATININE 1.04 11/17/2018   BUN 13 11/17/2018   NA 144 11/17/2018   K 4.3 11/17/2018   CL 106 11/17/2018   CO2 23 11/17/2018   Lab Results  Component Value Date   CHOL 138 04/01/2018   HDL 28 (L) 04/01/2018   LDLCALC 84 04/01/2018   TRIG 130 04/01/2018   CHOLHDL 4.9 04/01/2018   No results found for: TSH Lab Results  Component Value Date   HGBA1C 7.1 (H) 11/17/2018     Review of Systems  Constitutional: Negative for chills, fatigue, fever and weight loss.  HENT: Negative for drooling, ear discharge, ear pain and sore throat.   Eyes: Negative for blurred vision.  Respiratory: Negative for cough, shortness of breath and wheezing.   Cardiovascular: Negative for chest pain, palpitations and leg swelling.  Gastrointestinal: Negative for abdominal pain, blood in stool, constipation, diarrhea and nausea.  Endocrine: Negative for polydipsia, polyphagia and polyuria.  Genitourinary: Negative for dysuria, frequency, hematuria and urgency.  Musculoskeletal: Negative for back pain, myalgias and neck pain.  Skin: Negative for rash.  Allergic/Immunologic: Negative for environmental allergies.  Neurological: Negative for dizziness, focal weakness, weakness and headaches.  Hematological: Does not bruise/bleed easily.  Psychiatric/Behavioral: Negative for suicidal ideas. The patient is not nervous/anxious.     Patient Active Problem List   Diagnosis Date Noted  . Mixed hyperlipidemia 01/01/2017  . 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 H/O hypercholesterolemia 12/20/2014  . Flu vaccine need 12/20/2014  . Need for vaccination 12/20/2014  . Umbilical hernia without obstruction and without gangrene 12/20/2014  . Familial multiple lipoprotein-type hyperlipidemia 12/08/2014  . Alimentary obesity 12/08/2014  . Type 2 diabetes mellitus  with  complication, without long-term current use of insulin ([place]) 12/08/2014  . Essential (primary) hypertension 12/08/2014  . Blood in the urine 12/08/2014  . Routine general medical examination at a health care facility 12/08/2014  . Screening for depression 12/08/2014    No Known Allergies  Past Surgical History:  Procedure Laterality Date  . CATARACT EXTRACTION Bilateral 2003  . CYSTOSCOPY W/ RETROGRADES Bilateral 02/26/2015   Procedure: CYSTOSCOPY WITH RETROGRADE PYELOGRAM;  Surgeon: Hollice Espy, MD;  Location: [facility] ORS;  Service: Urology;  Laterality: Bilateral;  . HERNIA REPAIR  2015    Social History   Tobacco Use  . Smoking status: Former Smoker    Types: Cigarettes    Quit date: 02/23/1975    Years since quitting: 44.4  . Smokeless tobacco: Current User    Types: Chew  . Tobacco comment: smoked for only 6 mos out of his whole life  Substance Use Topics  . Alcohol use: No    Alcohol/week: 0.0 standard drinks  . Drug use: No     Medication list has been reviewed and updated.  Current Meds  Medication Sig  . aspirin 81 MG EC tablet TAKE 1 TABLET BY MOUTH EVERY DAY  . atorvastatin (LIPITOR) 40 MG tablet TAKE 1 TABLET BY MOUTH EVERY DAY  . metFORMIN (GLUCOPHAGE) 500 MG tablet TAKE 1 TABLET (500 MG TOTAL) BY MOUTH 2 (TWO) TIMES DAILY WITH A MEAL.  Marland Kitchen Omega-3 Fatty Acids (FISH OIL PO) Take 1 capsule by mouth daily. Reported on 08/08/2015    PHQ 2/9 Scores 07/12/2019 11/16/2018 04/01/2018 04/01/2018  PHQ - 2 Score 0 0 0 0  PHQ- 9 Score - 0 0 -    BP Readings from Last 3 Encounters:  07/12/19 138/70  05/31/19 130/64  07/27/18 128/62    Physical Exam Vitals and nursing note reviewed.  HENT:     Head: Normocephalic.     Right Ear: External ear normal.     Left Ear: External ear normal.     Nose: Nose normal.  Eyes:     General: No scleral icterus.       Right eye: No discharge.        Left eye: No discharge.     Conjunctiva/sclera: Conjunctivae normal.      Pupils: Pupils are equal, round, and reactive to light.  Neck:     Thyroid: No thyromegaly.     Vascular: No JVD.     Trachea: No tracheal deviation.  Cardiovascular:     Rate and Rhythm: Normal rate and regular rhythm.     Heart sounds: Normal heart sounds. No murmur. No friction rub. No gallop.   Pulmonary:     Effort: Pulmonary effort is normal. No respiratory distress.     Breath sounds: Normal breath sounds. No decreased breath sounds, wheezing, rhonchi or rales.  Abdominal:     General: Bowel sounds are normal.     Palpations: Abdomen is soft. There is no mass.     Tenderness: There is no abdominal tenderness. There is no guarding or rebound.  Genitourinary:    Prostate: Normal. Not enlarged, not tender and no nodules present.     Rectum: Normal. Guaiac result negative. No mass.  Musculoskeletal:        General: No tenderness. Normal range of motion.     Cervical back: Normal range of motion and neck supple.  Lymphadenopathy:     Cervical: No cervical adenopathy.  Skin:    General: Skin is warm.  Findings: No rash.  Neurological:     Mental Status: He is alert and oriented to person, place, and time.     Cranial Nerves: No cranial nerve deficit.     Deep Tendon Reflexes: Reflexes are normal and symmetric.     Wt Readings from Last 3 Encounters:  07/12/19 277 lb (125.6 kg)  05/31/19 279 lb (126.6 kg)  11/16/18 277 lb (125.6 kg)    BP 138/70   Pulse 68   Ht 6\' 1"  (1.854 m)   Wt 277 lb (125.6 kg)   BMI 36.55 kg/m   Assessment and Plan:  1. Type 2 diabetes mellitus with complication, without long-term current use of insulin ([facility]) Chronic.  Uncontrolled.  Patient states that since he started Metformin that his blood sugars have been running in the 200 range.  Patient used to be on Actos but was taken off of this because of weight gain.  But his blood sugars were in a better range at that time we will check an A1c and will resume his Actos and the question is  whether or not we will also continue Metformin depending on what his A1c is.  We will recheck in 6 weeks. - HgB A1c - pioglitazone (ACTOS) 15 MG tablet; Take 1 tablet (15 mg total) by mouth daily.  Dispense: 60 tablet; Refill: 0  2. Mixed hyperlipidemia Chronic.  Controlled.  Stable.  Patient was given a low-cholesterol diet.  We will check lipid panel. - Lipid Panel With LDL/HDL Ratio  3. Essential (primary) hypertension Patient's upper limits of normal blood pressure and would be best given his diabetes to be on ACE inhibitor.  We will resume lisinopril at 5 mg once a day. - lisinopril (ZESTRIL) 5 MG tablet; Take 1 tablet (5 mg total) by mouth daily.  Dispense: 60 tablet; Refill: 0  4. Taking medication for chronic disease Patient on atorvastatin 40 mg.  Will check lipid and hepatic panel. - Hepatic function panel  5. Nocturia Chronic.  Patient used to see Hollice Espy for which he had cystoscopy for hematuria.  Patient was noted to have moderate nonhemolyzed and patient will be referred back to London Sheer, MD - PSA - POCT urinalysis dipstick

## 2019-07-13 ENCOUNTER — Other Ambulatory Visit: Payer: Self-pay | Admitting: Family Medicine

## 2019-07-13 DIAGNOSIS — E118 Type 2 diabetes mellitus with unspecified complications: Secondary | ICD-10-CM

## 2019-07-13 LAB — LIPID PANEL WITH LDL/HDL RATIO
Cholesterol, Total: 154 mg/dL (ref 100–199)
HDL: 32 mg/dL — ABNORMAL LOW (ref >39)
LDL Chol Calc (NIH): 96 mg/dL (ref 0–99)
LDL/HDL Ratio: 3 ratio (ref 0.0–3.6)
Triglycerides: 145 mg/dL (ref 0–149)
VLDL Cholesterol Cal: 26 mg/dL (ref 5–40)

## 2019-07-13 LAB — HEPATIC FUNCTION PANEL
ALT: 40 IU/L (ref 0–44)
AST: 46 IU/L — ABNORMAL HIGH (ref 0–40)
Albumin: 4.2 g/dL (ref 3.7–4.7)
Alkaline Phosphatase: 98 IU/L (ref 39–117)
Bilirubin Total: 1.5 mg/dL — ABNORMAL HIGH (ref 0.0–1.2)
Bilirubin, Direct: 0.36 mg/dL (ref 0.00–0.40)
Total Protein: 6.5 g/dL (ref 6.0–8.5)

## 2019-07-13 LAB — HEMOGLOBIN A1C
Est. average glucose Bld gHb Est-mCnc: 186 mg/dL
Hgb A1c MFr Bld: 8.1 % — ABNORMAL HIGH (ref 4.8–5.6)

## 2019-07-13 LAB — PSA: Prostate Specific Ag, Serum: 2.9 ng/mL (ref 0.0–4.0)

## 2019-07-16 ENCOUNTER — Other Ambulatory Visit: Payer: Self-pay | Admitting: Family Medicine

## 2019-07-19 ENCOUNTER — Other Ambulatory Visit: Payer: Self-pay

## 2019-07-27 ENCOUNTER — Ambulatory Visit: Payer: PPO | Admitting: Family Medicine

## 2019-08-12 ENCOUNTER — Ambulatory Visit: Payer: PPO | Admitting: Urology

## 2019-08-23 ENCOUNTER — Other Ambulatory Visit: Payer: Self-pay

## 2019-08-23 ENCOUNTER — Ambulatory Visit (INDEPENDENT_AMBULATORY_CARE_PROVIDER_SITE_OTHER): Payer: PPO | Admitting: Family Medicine

## 2019-08-23 ENCOUNTER — Encounter: Payer: Self-pay | Admitting: Family Medicine

## 2019-08-23 VITALS — BP 130/80 | HR 68 | Ht 73.0 in | Wt 275.0 lb

## 2019-08-23 DIAGNOSIS — I700 Atherosclerosis of aorta: Secondary | ICD-10-CM

## 2019-08-23 DIAGNOSIS — E118 Type 2 diabetes mellitus with unspecified complications: Secondary | ICD-10-CM | POA: Diagnosis not present

## 2019-08-23 DIAGNOSIS — I10 Essential (primary) hypertension: Secondary | ICD-10-CM | POA: Diagnosis not present

## 2019-08-23 DIAGNOSIS — E7849 Other hyperlipidemia: Secondary | ICD-10-CM

## 2019-08-23 MED ORDER — PIOGLITAZONE HCL 15 MG PO TABS: 15.0000 mg | ORAL_TABLET | Freq: Every day | ORAL | 0 refills | Status: DC

## 2019-08-23 MED ORDER — ASPIRIN 81 MG PO TBEC: 81.0000 mg | DELAYED_RELEASE_TABLET | Freq: Every day | ORAL | 3 refills | Status: AC

## 2019-08-23 MED ORDER — LISINOPRIL 5 MG PO TABS: 5.0000 mg | ORAL_TABLET | Freq: Every day | ORAL | 0 refills | Status: DC

## 2019-08-23 MED ORDER — ATORVASTATIN CALCIUM 40 MG PO TABS: 40.0000 mg | ORAL_TABLET | Freq: Every day | ORAL | 1 refills | Status: DC

## 2019-08-23 NOTE — Progress Notes (Signed)
Date:  08/23/2019   Name:  Troy Rodriguez   DOB:  1941-04-19   MRN:  [id number]   Chief Complaint: Diabetes (stopped metformin in dec- "blood sugar was not dropping" seems to be "better off metformin"), Hyperlipidemia, and Hypertension  Diabetes He presents for his follow-up diabetic visit. He has type 2 diabetes mellitus. His disease course has been stable. There are no hypoglycemic associated symptoms. Pertinent negatives for hypoglycemia include no dizziness, headaches, nervousness/anxiousness or sweats. There are no diabetic associated symptoms. Pertinent negatives for diabetes include no blurred vision, no chest pain and no polydipsia. There are no hypoglycemic complications. Symptoms are stable. There are no diabetic complications. Pertinent negatives for diabetic complications include no CVA, PVD or retinopathy. There are no known risk factors for coronary artery disease. Current diabetic treatment includes oral agent (monotherapy). He is compliant with treatment some of the time. His weight is fluctuating minimally. He is following a generally healthy diet. Meal planning includes avoidance of concentrated sweets and carbohydrate counting. He has not had a previous visit with a dietitian. He participates in exercise intermittently. His home blood glucose trend is fluctuating minimally. His breakfast blood glucose is taken between 8-9 am. His breakfast blood glucose range is generally 140-180 mg/dl. An ACE inhibitor/angiotensin II receptor blocker is being taken. He does not see a podiatrist.Eye exam is not current.  Hyperlipidemia This is a chronic problem. The current episode started more than 1 year ago. The problem is controlled. Recent lipid tests were reviewed and are normal. He has no history of chronic renal disease, diabetes, hypothyroidism, liver disease, obesity or nephrotic syndrome. Pertinent negatives include no chest pain, focal sensory loss, focal weakness, leg pain, myalgias or  shortness of breath. Current antihyperlipidemic treatment includes statins. The current treatment provides moderate improvement of lipids. There are no compliance problems.  Risk factors for coronary artery disease include dyslipidemia, male sex, post-menopausal, hypertension and obesity.  Hypertension The current episode started more than 1 year ago. The problem has been waxing and waning since onset. The problem is controlled. Pertinent negatives include no anxiety, blurred vision, chest pain, headaches, malaise/fatigue, neck pain, orthopnea, palpitations, peripheral edema, PND, shortness of breath or sweats. There are no associated agents to hypertension. Risk factors for coronary artery disease include dyslipidemia, obesity, male gender and post-menopausal state. Past treatments include ACE inhibitors. The current treatment provides moderate improvement. There are no compliance problems.  There is no history of angina, kidney disease, CAD/MI, CVA, heart failure, left ventricular hypertrophy, PVD or retinopathy. There is no history of chronic renal disease, a hypertension causing med or renovascular disease.    Lab Results  Component Value Date   CREATININE 1.04 11/17/2018   BUN 13 11/17/2018   NA 144 11/17/2018   K 4.3 11/17/2018   CL 106 11/17/2018   CO2 23 11/17/2018   Lab Results  Component Value Date   CHOL 154 07/12/2019   HDL 32 (L) 07/12/2019   LDLCALC 96 07/12/2019   TRIG 145 07/12/2019   CHOLHDL 4.9 04/01/2018   No results found for: TSH Lab Results  Component Value Date   HGBA1C 8.1 (H) 07/12/2019     Review of Systems  Constitutional: Negative for chills, fever and malaise/fatigue.  HENT: Negative for drooling, ear discharge, ear pain and sore throat.   Eyes: Negative for blurred vision.  Respiratory: Negative for cough, shortness of breath and wheezing.   Cardiovascular: Negative for chest pain, palpitations, orthopnea, leg swelling and PND.  Gastrointestinal:  Negative for abdominal pain, blood in stool, constipation, diarrhea and nausea.  Endocrine: Negative for polydipsia.  Genitourinary: Negative for dysuria, frequency, hematuria and urgency.  Musculoskeletal: Negative for back pain, myalgias and neck pain.  Skin: Negative for rash.  Allergic/Immunologic: Negative for environmental allergies.  Neurological: Negative for dizziness, focal weakness and headaches.  Hematological: Does not bruise/bleed easily.  Psychiatric/Behavioral: Negative for suicidal ideas. The patient is not nervous/anxious.     Patient Active Problem List   Diagnosis Date Noted  . Mixed hyperlipidemia 01/01/2017  . 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 H/O hypercholesterolemia 12/20/2014  . Flu vaccine need 12/20/2014  . Need for vaccination 12/20/2014  . Umbilical hernia without obstruction and without gangrene 12/20/2014  . Familial multiple lipoprotein-type hyperlipidemia 12/08/2014  . Alimentary obesity 12/08/2014  . Type 2 diabetes mellitus with complication, without long-term current use of insulin ([place]) 12/08/2014  . Essential (primary) hypertension 12/08/2014  . Blood in the urine 12/08/2014  . Routine general medical examination at a health care facility 12/08/2014  . Screening for depression 12/08/2014    No Known Allergies  Past Surgical History:  Procedure Laterality Date  . CATARACT EXTRACTION Bilateral 2003  . CYSTOSCOPY W/ RETROGRADES Bilateral 02/26/2015   Procedure: CYSTOSCOPY WITH RETROGRADE PYELOGRAM;  Surgeon: Hollice Espy, MD;  Location: [facility] ORS;  Service: Urology;  Laterality: Bilateral;  . HERNIA REPAIR  2015    Social History   Tobacco Use  . Smoking status: Former Smoker    Types: Cigarettes    Quit date: 02/23/1975    Years since quitting: 44.5  . Smokeless tobacco: Current User    Types: Chew  . Tobacco comment: smoked for only 6 mos out of his whole life  Substance Use Topics  . Alcohol use: No    Alcohol/week: 0.0  standard drinks  . Drug use: No     Medication list has been reviewed and updated.  Current Meds  Medication Sig  . aspirin 81 MG EC tablet TAKE 1 TABLET BY MOUTH EVERY DAY  . atorvastatin (LIPITOR) 40 MG tablet Take 1 tablet (40 mg total) by mouth daily.  Marland Kitchen lisinopril (ZESTRIL) 5 MG tablet Take 1 tablet (5 mg total) by mouth daily.  . Omega-3 Fatty Acids (FISH OIL PO) Take 1 capsule by mouth daily. Reported on 08/08/2015  . pioglitazone (ACTOS) 15 MG tablet Take 1 tablet (15 mg total) by mouth daily.    PHQ 2/9 Scores 08/23/2019 07/12/2019 11/16/2018 04/01/2018  PHQ - 2 Score 0 0 0 0  PHQ- 9 Score 0 - 0 0    BP Readings from Last 3 Encounters:  08/23/19 130/80  07/12/19 138/70  05/31/19 130/64    Physical Exam Vitals and nursing note reviewed.  HENT:     Head: Normocephalic.     Right Ear: Tympanic membrane, ear canal and external ear normal. There is no impacted cerumen.     Left Ear: Tympanic membrane, ear canal and external ear normal. There is no impacted cerumen.     Nose: Nose normal. No congestion or rhinorrhea.     Mouth/Throat:     Mouth: Mucous membranes are moist.  Eyes:     General: No scleral icterus.       Right eye: No discharge.        Left eye: No discharge.     Conjunctiva/sclera: Conjunctivae normal.     Pupils: Pupils are equal, round, and reactive to light.  Neck:     Thyroid: No thyromegaly.  Vascular: No JVD.     Trachea: No tracheal deviation.  Cardiovascular:     Rate and Rhythm: Normal rate and regular rhythm.     Heart sounds: Normal heart sounds. No murmur. No friction rub. No gallop.   Pulmonary:     Effort: No respiratory distress.     Breath sounds: Normal breath sounds. No wheezing, rhonchi or rales.  Abdominal:     General: Bowel sounds are normal.     Palpations: Abdomen is soft. There is no mass.     Tenderness: There is no abdominal tenderness. There is no guarding or rebound.  Musculoskeletal:        General: No tenderness.  Normal range of motion.     Cervical back: Normal range of motion and neck supple.  Lymphadenopathy:     Cervical: No cervical adenopathy.  Skin:    General: Skin is warm.     Findings: No rash.  Neurological:     Mental Status: He is alert and oriented to person, place, and time.     Cranial Nerves: No cranial nerve deficit.     Deep Tendon Reflexes: Reflexes are normal and symmetric.     Wt Readings from Last 3 Encounters:  08/23/19 275 lb (124.7 kg)  07/12/19 277 lb (125.6 kg)  05/31/19 279 lb (126.6 kg)    BP 130/80   Pulse 68   Ht 6\' 1"  (1.854 m)   Wt 275 lb (124.7 kg)   BMI 36.28 kg/m   Assessment and Plan:  1. Type 2 diabetes mellitus with complication, without long-term current use of insulin (HCC) Chronic.  Controlled.  Stable.  Continue Actos 15 mg once a day patient stopped his Metformin because he thought it was adversely affecting his blood sugar and had a little bit of diarrhea however I have explained to him that if his A1c is not within a reasonable range then we may need to resume this as an extended release form.  In the meantime we will check A1c, microalbuminuria and renal function panel. - HgB A1c - Microalbumin, urine - Renal Function Panel - pioglitazone (ACTOS) 15 MG tablet; Take 1 tablet (15 mg total) by mouth daily.  Dispense: 60 tablet; Refill: 0  2. Essential (primary) hypertension Chronic.  Controlled.  Stable.  Continue lisinopril 5 mg once a day.  Will recheck renal function panel.  We will recheck patient in 6 months. - Renal Function Panel - lisinopril (ZESTRIL) 5 MG tablet; Take 1 tablet (5 mg total) by mouth daily.  Dispense: 60 tablet; Refill: 0  3. Familial multiple lipoprotein-type hyperlipidemia Chronic.  Controlled.  Stable.  Continue atorvastatin 40 mg once a day.  Will recheck patient in 6 months. - atorvastatin (LIPITOR) 40 MG tablet; Take 1 tablet (40 mg total) by mouth daily.  Dispense: 90 tablet; Refill: 1  4. Atherosclerosis  of abdominal aorta (HCC) View of a CT scan of the abdomen notes that patient has atherosclerosis of the abdominal aorta and patient has been encouraged to continue his aspirin 81 mg once a day.

## 2019-08-24 LAB — RENAL FUNCTION PANEL
Albumin: 4 g/dL (ref 3.7–4.7)
BUN/Creatinine Ratio: 12 (ref 10–24)
BUN: 16 mg/dL (ref 8–27)
CO2: 23 mmol/L (ref 20–29)
Calcium: 9.3 mg/dL (ref 8.6–10.2)
Chloride: 105 mmol/L (ref 96–106)
Creatinine, Ser: 1.39 mg/dL — ABNORMAL HIGH (ref 0.76–1.27)
GFR calc Af Amer: 55 mL/min/{1.73_m2} — ABNORMAL LOW (ref >59)
GFR calc non Af Amer: 48 mL/min/{1.73_m2} — ABNORMAL LOW (ref >59)
Glucose: 146 mg/dL — ABNORMAL HIGH (ref 65–99)
Phosphorus: 2.4 mg/dL — ABNORMAL LOW (ref 2.8–4.1)
Potassium: 4.5 mmol/L (ref 3.5–5.2)
Sodium: 142 mmol/L (ref 134–144)

## 2019-08-24 LAB — MICROALBUMIN, URINE: Microalbumin, Urine: 20.8 ug/mL

## 2019-08-24 LAB — HEMOGLOBIN A1C
Est. average glucose Bld gHb Est-mCnc: 169 mg/dL
Hgb A1c MFr Bld: 7.5 % — ABNORMAL HIGH (ref 4.8–5.6)

## 2019-08-25 ENCOUNTER — Other Ambulatory Visit: Payer: Self-pay

## 2019-08-25 DIAGNOSIS — E118 Type 2 diabetes mellitus with unspecified complications: Secondary | ICD-10-CM

## 2019-08-25 MED ORDER — METFORMIN HCL ER 500 MG PO TB24: 500.0000 mg | ORAL_TABLET | Freq: Every day | ORAL | 0 refills | Status: DC

## 2019-08-25 NOTE — Progress Notes (Unsigned)
Sent in [facility].

## 2019-09-02 ENCOUNTER — Other Ambulatory Visit: Payer: Self-pay | Admitting: Family Medicine

## 2019-09-02 DIAGNOSIS — E118 Type 2 diabetes mellitus with unspecified complications: Secondary | ICD-10-CM

## 2019-09-02 DIAGNOSIS — I10 Essential (primary) hypertension: Secondary | ICD-10-CM

## 2019-10-25 ENCOUNTER — Other Ambulatory Visit: Payer: Self-pay | Admitting: Family Medicine

## 2019-10-25 DIAGNOSIS — I10 Essential (primary) hypertension: Secondary | ICD-10-CM

## 2019-10-25 DIAGNOSIS — E118 Type 2 diabetes mellitus with unspecified complications: Secondary | ICD-10-CM

## 2019-11-16 ENCOUNTER — Ambulatory Visit (INDEPENDENT_AMBULATORY_CARE_PROVIDER_SITE_OTHER): Payer: PPO

## 2019-11-16 DIAGNOSIS — Z0000 Encounter for general adult medical examination without abnormal findings: Secondary | ICD-10-CM

## 2019-11-16 NOTE — Patient Instructions (Signed)
Troy Rodriguez , Thank you for taking time to come for your Medicare Wellness Visit. I appreciate your ongoing commitment to your health goals. Please review the following plan we discussed and let me know if I can assist you in the future.   Screening recommendations/referrals: Colonoscopy: no longer required Recommended yearly ophthalmology/optometry visit for glaucoma screening and checkup Recommended yearly dental visit for hygiene and checkup  Vaccinations: Influenza vaccine: done 05/31/19 Pneumococcal vaccine: done 12/29/16 Tdap vaccine: done 08/27/15 Shingles vaccine: Shingrix discussed. Please contact your pharmacy for coverage information.  Covid-19: done 08/02/19 & 08/23/19  Conditions/risks identified: Recommend drinking 6-8 glasses of water per day  Next appointment: Please follow up in one year for your Medicare Annual Wellness visit.    Preventive Care 5 Years and Older, Male Preventive care refers to lifestyle choices and visits with your health care provider that can promote health and wellness. What does preventive care include?  A yearly physical exam. This is also called an annual well check.  Dental exams once or twice a year.  Routine eye exams. Ask your health care provider how often you should have your eyes checked.  Personal lifestyle choices, including:  Daily care of your teeth and gums.  Regular physical activity.  Eating a healthy diet.  Avoiding tobacco and drug use.  Limiting alcohol use.  Practicing safe sex.  Taking low doses of aspirin every day.  Taking vitamin and mineral supplements as recommended by your health care provider. What happens during an annual well check? The services and screenings done by your health care provider during your annual well check will depend on your age, overall health, lifestyle risk factors, and family history of disease. Counseling  Your health care provider may ask you questions about your:  Alcohol  use.  Tobacco use.  Drug use.  Emotional well-being.  Home and relationship well-being.  Sexual activity.  Eating habits.  History of falls.  Memory and ability to understand (cognition).  Work and work Statistician. Screening  You may have the following tests or measurements:  Height, weight, and BMI.  Blood pressure.  Lipid and cholesterol levels. These may be checked every 5 years, or more frequently if you are over 84 years old.  Skin check.  Lung cancer screening. You may have this screening every year starting at age 59 if you have a 30-pack-year history of smoking and currently smoke or have quit within the past 15 years.  Fecal occult blood test (FOBT) of the stool. You may have this test every year starting at age 75.  Flexible sigmoidoscopy or colonoscopy. You may have a sigmoidoscopy every 5 years or a colonoscopy every 10 years starting at age 83.  Prostate cancer screening. Recommendations will vary depending on your family history and other risks.  Hepatitis C blood test.  Hepatitis B blood test.  Sexually transmitted disease (STD) testing.  Diabetes screening. This is done by checking your blood sugar (glucose) after you have not eaten for a while (fasting). You may have this done every 1-3 years.  Abdominal aortic aneurysm (AAA) screening. You may need this if you are a current or former smoker.  Osteoporosis. You may be screened starting at age 9 if you are at high risk. Talk with your health care provider about your test results, treatment options, and if necessary, the need for more tests. Vaccines  Your health care provider may recommend certain vaccines, such as:  Influenza vaccine. This is recommended every year.  Tetanus, diphtheria,  and acellular pertussis (Tdap, Td) vaccine. You may need a Td booster every 10 years.  Zoster vaccine. You may need this after age 34.  Pneumococcal 13-valent conjugate (PCV13) vaccine. One dose is  recommended after age 61.  Pneumococcal polysaccharide (PPSV23) vaccine. One dose is recommended after age 37. Talk to your health care provider about which screenings and vaccines you need and how often you need them. This information is not intended to replace advice given to you by your health care provider. Make sure you discuss any questions you have with your health care provider. Document Released: 08/10/2015 Document Revised: 04/02/2016 Document Reviewed: 05/15/2015 Elsevier Interactive Patient Education  2017 [place] Prevention in the Home Falls can cause injuries. They can happen to people of all ages. There are many things you can do to make your home safe and to help prevent falls. What can I do on the outside of my home?  Regularly fix the edges of walkways and driveways and fix any cracks.  Remove anything that might make you trip as you walk through a door, such as a raised step or threshold.  Trim any bushes or trees on the path to your home.  Use bright outdoor lighting.  Clear any walking paths of anything that might make someone trip, such as rocks or tools.  Regularly check to see if handrails are loose or broken. Make sure that both sides of any steps have handrails.  Any raised decks and porches should have guardrails on the edges.  Have any leaves, snow, or ice cleared regularly.  Use sand or salt on walking paths during winter.  Clean up any spills in your garage right away. This includes oil or grease spills. What can I do in the bathroom?  Use night lights.  Install grab bars by the toilet and in the tub and shower. Do not use towel bars as grab bars.  Use non-skid mats or decals in the tub or shower.  If you need to sit down in the shower, use a plastic, non-slip stool.  Keep the floor dry. Clean up any water that spills on the floor as soon as it happens.  Remove soap buildup in the tub or shower regularly.  Attach bath mats  securely with double-sided non-slip rug tape.  Do not have throw rugs and other things on the floor that can make you trip. What can I do in the bedroom?  Use night lights.  Make sure that you have a light by your bed that is easy to reach.  Do not use any sheets or blankets that are too big for your bed. They should not hang down onto the floor.  Have a firm chair that has side arms. You can use this for support while you get dressed.  Do not have throw rugs and other things on the floor that can make you trip. What can I do in the kitchen?  Clean up any spills right away.  Avoid walking on wet floors.  Keep items that you use a lot in easy-to-reach places.  If you need to reach something above you, use a strong step stool that has a grab bar.  Keep electrical cords out of the way.  Do not use floor polish or wax that makes floors slippery. If you must use wax, use non-skid floor wax.  Do not have throw rugs and other things on the floor that can make you trip. What can I do with my  stairs?  Do not leave any items on the stairs.  Make sure that there are handrails on both sides of the stairs and use them. Fix handrails that are broken or loose. Make sure that handrails are as long as the stairways.  Check any carpeting to make sure that it is firmly attached to the stairs. Fix any carpet that is loose or worn.  Avoid having throw rugs at the top or bottom of the stairs. If you do have throw rugs, attach them to the floor with carpet tape.  Make sure that you have a light switch at the top of the stairs and the bottom of the stairs. If you do not have them, ask someone to add them for you. What else can I do to help prevent falls?  Wear shoes that:  Do not have high heels.  Have rubber bottoms.  Are comfortable and fit you well.  Are closed at the toe. Do not wear sandals.  If you use a stepladder:  Make sure that it is fully opened. Do not climb a closed  stepladder.  Make sure that both sides of the stepladder are locked into place.  Ask someone to hold it for you, if possible.  Clearly mark and make sure that you can see:  Any grab bars or handrails.  First and last steps.  Where the edge of each step is.  Use tools that help you move around (mobility aids) if they are needed. These include:  Canes.  Walkers.  Scooters.  Crutches.  Turn on the lights when you go into a dark area. Replace any light bulbs as soon as they burn out.  Set up your furniture so you have a clear path. Avoid moving your furniture around.  If any of your floors are uneven, fix them.  If there are any pets around you, be aware of where they are.  Review your medicines with your doctor. Some medicines can make you feel dizzy. This can increase your chance of falling. Ask your doctor what other things that you can do to help prevent falls. This information is not intended to replace advice given to you by your health care provider. Make sure you discuss any questions you have with your health care provider. Document Released: 05/10/2009 Document Revised: 12/20/2015 Document Reviewed: 08/18/2014 Elsevier Interactive Patient Education  2017 [employer].

## 2019-11-16 NOTE — Progress Notes (Signed)
Subjective:   Troy Rodriguez is a 79 y.o. male who presents for Medicare Annual/Subsequent preventive examination.  Virtual Visit via Telephone Note  I connected with Troy Rodriguez on 11/16/19 at 10:40 AM EDT by telephone and verified that I am speaking with the correct person using two identifiers.  Medicare Annual Wellness visit completed telephonically due to Covid-19 pandemic.   Location: Patient: home Provider: office   I discussed the limitations, risks, security and privacy concerns of performing an evaluation and management service by telephone and the availability of in person appointments. The patient expressed understanding and agreed to proceed.  Some vital signs may be absent or patient reported.   Clemetine Marker, LPN    Review of Systems:   Cardiac Risk Factors include: advanced age (>45men, >16 women);diabetes mellitus;dyslipidemia;male gender;hypertension     Objective:    Vitals: There were no vitals taken for this visit.  There is no height or weight on file to calculate BMI.  Advanced Directives 01/01/2017 12/29/2016 02/26/2015  Does Patient Have a Medical Advance Directive? Yes No;Yes No  Type of Advance Directive - [place];Living will -  Copy of [place] in Chart? Yes No - copy requested -  Would patient like information on creating a medical advance directive? - Yes (MAU/Ambulatory/Procedural Areas - Information given) -    Tobacco Social History   Tobacco Use  Smoking Status Former Smoker  . Types: Cigarettes  . Quit date: 02/23/1975  . Years since quitting: 44.7  Smokeless Tobacco Current User  . Types: Chew  Tobacco Comment   smoked for only 6 mos out of his whole life     Ready to quit: Not Answered Counseling given: Not Answered Comment: smoked for only 6 mos out of his whole life   Clinical Intake:  Pre-visit preparation completed: Yes  Pain : No/denies pain     Nutritional Risks:  None Diabetes: Yes CBG done?: No Did pt. bring in CBG monitor from home?: No  How often do you need to have someone help you when you read instructions, pamphlets, or other written materials from your doctor or pharmacy?: 1 - Never  Interpreter Needed?: No  Information entered by :: Clemetine Marker LPN  Past Medical History:  Diagnosis Date  . Arthritis   . Diabetes ([place])   . Hematuria   . HLD (hyperlipidemia)   . Umbilical hernia    Past Surgical History:  Procedure Laterality Date  . CATARACT EXTRACTION Bilateral 2003  . CYSTOSCOPY W/ RETROGRADES Bilateral 02/26/2015   Procedure: CYSTOSCOPY WITH RETROGRADE PYELOGRAM;  Surgeon: Hollice Espy, MD;  Location: [facility] ORS;  Service: Urology;  Laterality: Bilateral;  . HERNIA REPAIR  2015   Family History  Problem Relation Age of Onset  . Prostate cancer Brother   . Kidney disease Neg Hx    Social History   Socioeconomic History  . Marital status: Married    Spouse name: Not on file  . Number of children: Not on file  . Years of education: Not on file  . Highest education level: Not on file  Occupational History  . Not on file  Tobacco Use  . Smoking status: Former Smoker    Types: Cigarettes    Quit date: 02/23/1975    Years since quitting: 44.7  . Smokeless tobacco: Current User    Types: Chew  . Tobacco comment: smoked for only 6 mos out of his whole life  Substance and Sexual Activity  .  Alcohol use: No    Alcohol/week: 0.0 standard drinks  . Drug use: No  . Sexual activity: Not Currently  Other Topics Concern  . Not on file  Social History Narrative   Caffeine use:2 per day.   Social Determinants of Health   Financial Resource Strain: Low Risk   . Difficulty of Paying Living Expenses: Not hard at all  Food Insecurity: No Food Insecurity  . Worried About Charity fundraiser in the Last Year: Never true  . Ran Out of Food in the Last Year: Never true  Transportation Needs: No Transportation Needs  . Lack of  Transportation (Medical): No  . Lack of Transportation (Non-Medical): No  Physical Activity: Sufficiently Active  . Days of Exercise per Week: 7 days  . Minutes of Exercise per Session: 30 min  Stress: No Stress Concern Present  . Feeling of Stress : Not at all  Social Connections: Unknown  . Frequency of Communication with Friends and Family: Patient refused  . Frequency of Social Gatherings with Friends and Family: Patient refused  . Attends Religious Services: Patient refused  . Active Member of Clubs or Organizations: Patient refused  . Attends Archivist Meetings: Patient refused  . Marital Status: Married    Outpatient Encounter Medications as of 11/16/2019  Medication Sig  . aspirin 81 MG EC tablet Take 1 tablet (81 mg total) by mouth daily. Swallow whole.  Marland Kitchen atorvastatin (LIPITOR) 40 MG tablet Take 1 tablet (40 mg total) by mouth daily.  Marland Kitchen lisinopril (ZESTRIL) 5 MG tablet TAKE 1 TABLET BY MOUTH EVERY DAY  . metFORMIN (GLUCOPHAGE-XR) 500 MG 24 hr tablet Take 1 tablet (500 mg total) by mouth daily with breakfast.  . Omega-3 Fatty Acids (FISH OIL PO) Take 1 capsule by mouth daily. Reported on 08/08/2015  . pioglitazone (ACTOS) 15 MG tablet TAKE 1 TABLET BY MOUTH EVERY DAY   No facility-administered encounter medications on file as of 11/16/2019.    Activities of Daily Living In your present state of health, do you have any difficulty performing the following activities: 11/16/2019  Hearing? Y  Comment declines hearing aids  Vision? N  Difficulty concentrating or making decisions? N  Walking or climbing stairs? N  Dressing or bathing? N  Doing errands, shopping? N  Preparing Food and eating ? N  Using the Toilet? N  In the past six months, have you accidently leaked urine? N  Do you have problems with loss of bowel control? N  Managing your Medications? N  Managing your Finances? N  Housekeeping or managing your Housekeeping? N  Some recent data might be hidden      Patient Care Team: Juline Patch, MD as PCP - General (Family Medicine)   Assessment:   This is a routine wellness examination for Troy Rodriguez.  Exercise Activities and Dietary recommendations Current Exercise Habits: Home exercise routine, Type of exercise: walking, Time (Minutes): 30, Frequency (Times/Week): 7, Weekly Exercise (Minutes/Week): 210, Intensity: Mild, Exercise limited by: None identified  Goals    . Increase water intake     Recommend drinking 4-5 glasses of water a day       Fall Risk Fall Risk  11/16/2019 08/23/2019 11/16/2018 04/01/2018 12/29/2016  Falls in the past year? 0 0 0 No No  Number falls in past yr: 0 - - - -  Injury with Fall? 0 - - - -  Risk for fall due to : No Fall Risks - - - -  Follow  up Falls prevention discussed Falls evaluation completed Falls evaluation completed - -   FALL RISK PREVENTION PERTAINING TO THE HOME:  Any stairs in or around the home? Yes  If so, do they handrails? Yes   Home free of loose throw rugs in walkways, pet beds, electrical cords, etc? Yes  Adequate lighting in your home to reduce risk of falls? Yes   ASSISTIVE DEVICES UTILIZED TO PREVENT FALLS:  Life alert? No  Use of a cane, walker or w/c? No  Grab bars in the bathroom? Yes  Shower chair or bench in shower? No  Elevated toilet seat or a handicapped toilet? No   DME ORDERS:  DME order needed?  No   TIMED UP AND GO:  Was the test performed? No . Telephonic visit.  Education: Fall risk prevention has been discussed.  Intervention(s) required? No    Depression Screen PHQ 2/9 Scores 11/16/2019 08/23/2019 07/12/2019 11/16/2018  PHQ - 2 Score 0 0 0 0  PHQ- 9 Score - 0 - 0    Cognitive Function - 6CIT declined for 2021 AWV; pt reports no memory issues     6CIT Screen 12/29/2016  What Year? 0 points  What month? 0 points  What time? 0 points  Count back from 20 0 points  Months in reverse 0 points  Repeat phrase 0 points  Total Score 0    Immunization  History  Administered Date(s) Administered  . Fluad Quad(high Dose 65+) 05/31/2019  . Influenza, High Dose Seasonal PF 08/04/2017, 04/01/2018  . Influenza,inj,Quad PF,6+ Mos 07/03/2016  . Influenza-Unspecified 05/22/2014  . PFIZER SARS-COV-2 Vaccination 08/02/2019, 08/23/2019  . Pneumococcal Conjugate-13 11/13/2014  . Pneumococcal Polysaccharide-23 12/29/2016  . Tdap 08/27/2015  . Zoster 07/28/2013    Qualifies for Shingles Vaccine? Yes  Zostavax completed 2015. Due for Shingrix. Education has been provided regarding the importance of this vaccine. Pt has been advised to call insurance company to determine out of pocket expense. Advised may also receive vaccine at local pharmacy or Health Dept. Verbalized acceptance and understanding.  Tdap: Up to date  Flu Vaccine: Up to date  Pneumococcal Vaccine: Up to date   Screening Tests Health Maintenance  Topic Date Due  . OPHTHALMOLOGY EXAM  03/28/2017  . FOOT EXAM  04/02/2019  . HEMOGLOBIN A1C  02/20/2020  . INFLUENZA VACCINE  02/26/2020  . TETANUS/TDAP  08/26/2025  . COVID-19 Vaccine  Completed  . PNA vac Low Risk Adult  Completed   Cancer Screenings:  Colorectal Screening: No longer required.   Lung Cancer Screening: (Low Dose CT Chest recommended if Age 19-80 years, 30 pack-year currently smoking OR have quit w/in 15years.) does not qualify.   Additional Screening:  Hepatitis C Screening: no longer required  Vision Screening: Recommended annual ophthalmology exams for early detection of glaucoma and other disorders of the eye. Is the patient up to date with their annual eye exam?  No  Who is the provider or what is the name of the office in which the pt attends annual eye exams? [facility]   Dental Screening: Recommended annual dental exams for proper oral hygiene  Community Resource Referral:  CRR required this visit?  No       Plan:    I have personally reviewed and addressed the Medicare Annual Wellness  questionnaire and have noted the following in the patient's chart:  A. Medical and social history B. Use of alcohol, tobacco or illicit drugs  C. Current medications and supplements D. Functional ability and  status E.  Nutritional status F.  Physical activity G. Advance directives H. List of other physicians I.  Hospitalizations, surgeries, and ER visits in previous 12 months J.  [place] such as hearing and vision if needed, cognitive and depression L. Referrals and appointments   In addition, I have reviewed and discussed with patient certain preventive protocols, quality metrics, and best practice recommendations. A written personalized care plan for preventive services as well as general preventive health recommendations were provided to patient.   Signed,  Clemetine Marker, LPN Nurse Health Advisor   Nurse Notes: none

## 2019-11-20 ENCOUNTER — Other Ambulatory Visit: Payer: Self-pay | Admitting: Family Medicine

## 2019-11-20 DIAGNOSIS — E118 Type 2 diabetes mellitus with unspecified complications: Secondary | ICD-10-CM

## 2019-11-20 NOTE — Telephone Encounter (Signed)
Requested Prescriptions  Pending Prescriptions Disp Refills  . metFORMIN (GLUCOPHAGE-XR) 500 MG 24 hr tablet [Pharmacy Med Name: METFORMIN HCL ER 500 MG TABLET] 90 tablet 1    Sig: TAKE 1 TABLET BY MOUTH EVERY DAY WITH BREAKFAST     Endocrinology:  Diabetes - Biguanides Failed - 11/20/2019 12:59 AM      Failed - Cr in normal range and within 360 days    Creatinine  Date Value Ref Range Status  10/27/2012 1.18 0.60 - 1.30 mg/dL Final   Creatinine, Ser  Date Value Ref Range Status  08/23/2019 1.39 (H) 0.76 - 1.27 mg/dL Final         Failed - eGFR in normal range and within 360 days    EGFR (African American)  Date Value Ref Range Status  10/27/2012 >60  Final   GFR calc Af Amer  Date Value Ref Range Status  08/23/2019 55 (L) >59 mL/min/1.73 Final   EGFR (Non-African Amer.)  Date Value Ref Range Status  10/27/2012 >60  Final    Comment:    eGFR values <52m/min/1.73 m2 may be an indication of chronic kidney disease (CKD). Calculated eGFR is useful in patients with stable renal function. The eGFR calculation will not be reliable in acutely ill patients when serum creatinine is changing rapidly. It is not useful in  patients on dialysis. The eGFR calculation may not be applicable to patients at the low and high extremes of body sizes, pregnant women, and vegetarians.    GFR calc non Af Amer  Date Value Ref Range Status  08/23/2019 48 (L) >59 mL/min/1.73 Final         Passed - HBA1C is between 0 and 7.9 and within 180 days    Hgb A1c MFr Bld  Date Value Ref Range Status  08/23/2019 7.5 (H) 4.8 - 5.6 % Final    Comment:             Prediabetes: 5.7 - 6.4          Diabetes: >6.4          Glycemic control for adults with diabetes: <7.0          Passed - Valid encounter within last 6 months    Recent Outpatient Visits          2 months ago Type 2 diabetes mellitus with complication, without long-term current use of insulin [place]   [facility]   4 months ago Type 2 diabetes mellitus with complication, without long-term current use of insulin ([place]   [facility]   5 months ago Skin lesion of face   [facility]   1 year ago Type 2 diabetes mellitus with complication, without long-term current use of insulin ([place]   [facility]   1 year ago Type 2 diabetes mellitus with complication, without long-term current use of insulin ([place]   [facility]      Future Appointments            In 2 weeks [facility]           To have labs checked with next appt.

## 2019-12-08 ENCOUNTER — Other Ambulatory Visit: Payer: Self-pay

## 2019-12-08 ENCOUNTER — Encounter: Payer: Self-pay | Admitting: Family Medicine

## 2019-12-08 ENCOUNTER — Ambulatory Visit (INDEPENDENT_AMBULATORY_CARE_PROVIDER_SITE_OTHER): Payer: PPO | Admitting: Family Medicine

## 2019-12-08 VITALS — BP 120/64 | HR 72 | Ht 73.0 in | Wt 274.0 lb

## 2019-12-08 DIAGNOSIS — I10 Essential (primary) hypertension: Secondary | ICD-10-CM

## 2019-12-08 DIAGNOSIS — E7849 Other hyperlipidemia: Secondary | ICD-10-CM

## 2019-12-08 DIAGNOSIS — E118 Type 2 diabetes mellitus with unspecified complications: Secondary | ICD-10-CM | POA: Diagnosis not present

## 2019-12-08 MED ORDER — ATORVASTATIN CALCIUM 40 MG PO TABS: 40.0000 mg | ORAL_TABLET | Freq: Every day | ORAL | 1 refills | Status: DC

## 2019-12-08 MED ORDER — PIOGLITAZONE HCL 15 MG PO TABS: 15.0000 mg | ORAL_TABLET | Freq: Every day | ORAL | 1 refills | Status: DC

## 2019-12-08 MED ORDER — METFORMIN HCL ER 500 MG PO TB24: ORAL_TABLET | ORAL | 1 refills | Status: DC

## 2019-12-08 MED ORDER — LISINOPRIL 5 MG PO TABS: 5.0000 mg | ORAL_TABLET | Freq: Every day | ORAL | 1 refills | Status: DC

## 2019-12-08 NOTE — Progress Notes (Signed)
Date:  12/08/2019   Name:  Troy Rodriguez   DOB:  1941-06-24   MRN:  [id number]   Chief Complaint: Diabetes, Hyperlipidemia, and Hypertension  Diabetes He presents for his follow-up diabetic visit. He has type 2 diabetes mellitus. His disease course has been stable. There are no hypoglycemic associated symptoms. Pertinent negatives for hypoglycemia include no dizziness, headaches, nervousness/anxiousness or sweats. There are no diabetic associated symptoms. Pertinent negatives for diabetes include no blurred vision, no chest pain, no fatigue, no foot paresthesias, no foot ulcerations, no polydipsia, no polyphagia, no polyuria, no visual change, no weakness and no weight loss. There are no hypoglycemic complications. Symptoms are stable. There are no diabetic complications. Pertinent negatives for diabetic complications include no CVA or PVD. Risk factors for coronary artery disease include dyslipidemia, male sex and obesity. He is compliant with treatment all of the time. His weight is stable. He is following a generally healthy diet. Meal planning includes avoidance of concentrated sweets and carbohydrate counting. He participates in exercise daily. His breakfast blood glucose is taken between 8-9 am. His breakfast blood glucose range is generally 140-180 mg/dl. An ACE inhibitor/angiotensin II receptor blocker is being taken. He does not see a podiatrist.Eye exam is current.  Hyperlipidemia This is a chronic problem. The current episode started more than 1 year ago. Recent lipid tests were reviewed and are normal. Exacerbating diseases include diabetes and obesity. He has no history of chronic renal disease, hypothyroidism, liver disease or nephrotic syndrome. There are no known factors aggravating his hyperlipidemia. Pertinent negatives include no chest pain, focal sensory loss, focal weakness, leg pain, myalgias or shortness of breath. Current antihyperlipidemic treatment includes statins. The  current treatment provides moderate improvement of lipids. There are no compliance problems.  Risk factors for coronary artery disease include diabetes mellitus, hypertension and dyslipidemia.  Hypertension This is a chronic problem. The current episode started more than 1 year ago. The problem has been gradually improving since onset. The problem is controlled. Pertinent negatives include no blurred vision, chest pain, headaches, malaise/fatigue, neck pain, orthopnea, palpitations, peripheral edema, PND, shortness of breath or sweats. Risk factors for coronary artery disease include dyslipidemia, diabetes mellitus and obesity. Past treatments include ACE inhibitors. The current treatment provides moderate improvement. There are no compliance problems.  There is no history of angina, kidney disease, CAD/MI, CVA, heart failure, left ventricular hypertrophy or PVD. There is no history of chronic renal disease, a hypertension causing med or renovascular disease.    Lab Results  Component Value Date   CREATININE 1.39 (H) 08/23/2019   BUN 16 08/23/2019   NA 142 08/23/2019   K 4.5 08/23/2019   CL 105 08/23/2019   CO2 23 08/23/2019   Lab Results  Component Value Date   CHOL 154 07/12/2019   HDL 32 (L) 07/12/2019   LDLCALC 96 07/12/2019   TRIG 145 07/12/2019   CHOLHDL 4.9 04/01/2018   No results found for: TSH Lab Results  Component Value Date   HGBA1C 7.5 (H) 08/23/2019   Lab Results  Component Value Date   WBC 4.7 02/20/2015   HGB 15.1 02/20/2015   HCT 45.6 02/20/2015   MCV 91 02/20/2015   PLT 176 02/20/2015   Lab Results  Component Value Date   ALT 40 07/12/2019   AST 46 (H) 07/12/2019   ALKPHOS 98 07/12/2019   BILITOT 1.5 (H) 07/12/2019     Review of Systems  Constitutional: Negative for chills, fatigue, fever, malaise/fatigue and weight  loss.  HENT: Negative for drooling, ear discharge, ear pain and sore throat.   Eyes: Negative for blurred vision.  Respiratory: Negative  for cough, shortness of breath and wheezing.   Cardiovascular: Negative for chest pain, palpitations, orthopnea, leg swelling and PND.  Gastrointestinal: Negative for abdominal pain, blood in stool, constipation, diarrhea and nausea.  Endocrine: Negative for polydipsia, polyphagia and polyuria.  Genitourinary: Negative for dysuria, frequency, hematuria and urgency.  Musculoskeletal: Negative for back pain, myalgias and neck pain.  Skin: Negative for rash.  Allergic/Immunologic: Negative for environmental allergies.  Neurological: Negative for dizziness, focal weakness, weakness and headaches.  Hematological: Does not bruise/bleed easily.  Psychiatric/Behavioral: Negative for suicidal ideas. The patient is not nervous/anxious.     Patient Active Problem List   Diagnosis Date Noted  . Mixed hyperlipidemia 01/01/2017  . 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 H/O hypercholesterolemia 12/20/2014  . Flu vaccine need 12/20/2014  . Need for vaccination 12/20/2014  . Umbilical hernia without obstruction and without gangrene 12/20/2014  . Familial multiple lipoprotein-type hyperlipidemia 12/08/2014  . Alimentary obesity 12/08/2014  . Type 2 diabetes mellitus with complication, without long-term current use of insulin ([place]) 12/08/2014  . Essential (primary) hypertension 12/08/2014  . Blood in the urine 12/08/2014  . Routine general medical examination at a health care facility 12/08/2014  . Screening for depression 12/08/2014    No Known Allergies  Past Surgical History:  Procedure Laterality Date  . CATARACT EXTRACTION Bilateral 2003  . CYSTOSCOPY W/ RETROGRADES Bilateral 02/26/2015   Procedure: CYSTOSCOPY WITH RETROGRADE PYELOGRAM;  Surgeon: Hollice Espy, MD;  Location: [facility] ORS;  Service: Urology;  Laterality: Bilateral;  . HERNIA REPAIR  2015    Social History   Tobacco Use  . Smoking status: Former Smoker    Types: Cigarettes    Quit date: 02/23/1975    Years since quitting:  44.8  . Smokeless tobacco: Current User    Types: Chew  . Tobacco comment: smoked for only 6 mos out of his whole life  Substance Use Topics  . Alcohol use: No    Alcohol/week: 0.0 standard drinks  . Drug use: No     Medication list has been reviewed and updated.  Current Meds  Medication Sig  . aspirin 81 MG EC tablet Take 1 tablet (81 mg total) by mouth daily. Swallow whole.  Marland Kitchen atorvastatin (LIPITOR) 40 MG tablet Take 1 tablet (40 mg total) by mouth daily.  Marland Kitchen lisinopril (ZESTRIL) 5 MG tablet TAKE 1 TABLET BY MOUTH EVERY DAY  . metFORMIN (GLUCOPHAGE-XR) 500 MG 24 hr tablet TAKE 1 TABLET BY MOUTH EVERY DAY WITH BREAKFAST  . Omega-3 Fatty Acids (FISH OIL PO) Take 1 capsule by mouth daily. Reported on 08/08/2015  . pioglitazone (ACTOS) 15 MG tablet TAKE 1 TABLET BY MOUTH EVERY DAY    PHQ 2/9 Scores 12/08/2019 11/16/2019 08/23/2019 07/12/2019  PHQ - 2 Score 0 0 0 0  PHQ- 9 Score 0 - 0 -    BP Readings from Last 3 Encounters:  12/08/19 120/64  08/23/19 130/80  07/12/19 138/70    Physical Exam Vitals and nursing note reviewed.  HENT:     Head: Normocephalic.     Right Ear: Tympanic membrane, ear canal and external ear normal.     Left Ear: Tympanic membrane, ear canal and external ear normal.     Nose: Nose normal. No congestion or rhinorrhea.     Mouth/Throat:     Mouth: Mucous membranes are moist.  Eyes:  General: No scleral icterus.       Right eye: No discharge.        Left eye: No discharge.     Conjunctiva/sclera: Conjunctivae normal.     Pupils: Pupils are equal, round, and reactive to light.  Neck:     Thyroid: No thyromegaly.     Vascular: No JVD.     Trachea: No tracheal deviation.  Cardiovascular:     Rate and Rhythm: Normal rate and regular rhythm.     Pulses: Normal pulses.          Dorsalis pedis pulses are 2+ on the right side and 2+ on the left side.       Posterior tibial pulses are 2+ on the right side and 2+ on the left side.     Heart sounds:  Normal heart sounds. No murmur. No friction rub. No gallop.   Pulmonary:     Effort: No respiratory distress.     Breath sounds: Normal breath sounds. No wheezing, rhonchi or rales.  Abdominal:     General: Bowel sounds are normal.     Palpations: Abdomen is soft. There is no mass.     Tenderness: There is no abdominal tenderness. There is no guarding or rebound.  Musculoskeletal:        General: No tenderness. Normal range of motion.     Cervical back: Normal range of motion and neck supple.  Feet:     Right foot:     Protective Sensation: 10 sites tested. 10 sites sensed.     Skin integrity: Callus and dry skin present.     Toenail Condition: Right toenails are abnormally thick.     Left foot:     Protective Sensation: 10 sites tested. 10 sites sensed.     Skin integrity: Callus and dry skin present.     Toenail Condition: Left toenails are abnormally thick.  Lymphadenopathy:     Cervical: No cervical adenopathy.  Skin:    General: Skin is warm.     Capillary Refill: Capillary refill takes less than 2 seconds.     Findings: No rash.  Neurological:     Mental Status: He is alert and oriented to person, place, and time.     Cranial Nerves: No cranial nerve deficit.     Deep Tendon Reflexes: Reflexes are normal and symmetric.     Wt Readings from Last 3 Encounters:  12/08/19 274 lb (124.3 kg)  08/23/19 275 lb (124.7 kg)  07/12/19 277 lb (125.6 kg)    BP 120/64   Pulse 72   Ht 6\' 1"  (1.854 m)   Wt 274 lb (124.3 kg)   BMI 36.15 kg/m   Assessment and Plan:  1. Type 2 diabetes mellitus with complication, without long-term current use of insulin (HCC) Chronic.  Uncontrolled stable.  Last A1c was 7.5 previously that was 8.1.  Unfortunately patient could not take Metformin on a twice a day basis.  We will recheck his A1c in the meantime continue Metformin XR 500 mg once a day with pioglitazone 15 mg once a day.  Exam today was done and there was other than some dry skin and  calluses with thickened nails no abnormality with vascular or skin breakdown. - HgB A1c - metFORMIN (GLUCOPHAGE-XR) 500 MG 24 hr tablet; TAKE 1 TABLET BY MOUTH EVERY DAY WITH BREAKFAST  Dispense: 90 tablet; Refill: 1 - pioglitazone (ACTOS) 15 MG tablet; Take 1 tablet (15 mg total) by mouth daily.  Dispense: 90 tablet; Refill: 1  2. Essential (primary) hypertension Chronic.  Controlled.  Stable.  Continue lisinopril 5 mg once a day as well as checking a renal function panel to assess GFR. - Renal Function Panel - lisinopril (ZESTRIL) 5 MG tablet; Take 1 tablet (5 mg total) by mouth daily.  Dispense: 90 tablet; Refill: 1  3. Familial multiple lipoprotein-type hyperlipidemia Chronic.  Controlled.  Stable.  Continue atorvastatin 40 mg once a day.  Will assess lipid control with lipid panel with LDL HDL ratio. - Lipid Panel With LDL/HDL Ratio - atorvastatin (LIPITOR) 40 MG tablet; Take 1 tablet (40 mg total) by mouth daily.  Dispense: 90 tablet; Refill: 1

## 2019-12-09 LAB — RENAL FUNCTION PANEL
Albumin: 4.1 g/dL (ref 3.7–4.7)
BUN/Creatinine Ratio: 10 (ref 10–24)
BUN: 14 mg/dL (ref 8–27)
CO2: 26 mmol/L (ref 20–29)
Calcium: 9.2 mg/dL (ref 8.6–10.2)
Chloride: 107 mmol/L — ABNORMAL HIGH (ref 96–106)
Creatinine, Ser: 1.37 mg/dL — ABNORMAL HIGH (ref 0.76–1.27)
GFR calc Af Amer: 56 mL/min/{1.73_m2} — ABNORMAL LOW (ref >59)
GFR calc non Af Amer: 48 mL/min/{1.73_m2} — ABNORMAL LOW (ref >59)
Glucose: 142 mg/dL — ABNORMAL HIGH (ref 65–99)
Phosphorus: 2.8 mg/dL (ref 2.8–4.1)
Potassium: 4.6 mmol/L (ref 3.5–5.2)
Sodium: 144 mmol/L (ref 134–144)

## 2019-12-09 LAB — LIPID PANEL WITH LDL/HDL RATIO
Cholesterol, Total: 136 mg/dL (ref 100–199)
HDL: 33 mg/dL — ABNORMAL LOW (ref >39)
LDL Chol Calc (NIH): 85 mg/dL (ref 0–99)
LDL/HDL Ratio: 2.6 ratio (ref 0.0–3.6)
Triglycerides: 97 mg/dL (ref 0–149)
VLDL Cholesterol Cal: 18 mg/dL (ref 5–40)

## 2019-12-09 LAB — HEMOGLOBIN A1C
Est. average glucose Bld gHb Est-mCnc: 134 mg/dL
Hgb A1c MFr Bld: 6.3 % — ABNORMAL HIGH (ref 4.8–5.6)

## 2019-12-29 DIAGNOSIS — L578 Other skin changes due to chronic exposure to nonionizing radiation: Secondary | ICD-10-CM | POA: Diagnosis not present

## 2019-12-29 DIAGNOSIS — Z85828 Personal history of other malignant neoplasm of skin: Secondary | ICD-10-CM | POA: Diagnosis not present

## 2020-04-03 ENCOUNTER — Encounter: Payer: Self-pay | Admitting: Family Medicine

## 2020-04-03 ENCOUNTER — Ambulatory Visit (INDEPENDENT_AMBULATORY_CARE_PROVIDER_SITE_OTHER): Payer: PPO | Admitting: Family Medicine

## 2020-04-03 ENCOUNTER — Other Ambulatory Visit: Payer: Self-pay

## 2020-04-03 VITALS — BP 130/70 | HR 60 | Ht 73.0 in | Wt 278.0 lb

## 2020-04-03 DIAGNOSIS — I10 Essential (primary) hypertension: Secondary | ICD-10-CM

## 2020-04-03 DIAGNOSIS — E7849 Other hyperlipidemia: Secondary | ICD-10-CM | POA: Diagnosis not present

## 2020-04-03 DIAGNOSIS — Z23 Encounter for immunization: Secondary | ICD-10-CM

## 2020-04-03 DIAGNOSIS — E118 Type 2 diabetes mellitus with unspecified complications: Secondary | ICD-10-CM | POA: Diagnosis not present

## 2020-04-03 MED ORDER — METFORMIN HCL ER 500 MG PO TB24: ORAL_TABLET | ORAL | 1 refills | Status: DC

## 2020-04-03 MED ORDER — PIOGLITAZONE HCL 15 MG PO TABS: 15.0000 mg | ORAL_TABLET | Freq: Every day | ORAL | 1 refills | Status: DC

## 2020-04-03 MED ORDER — LISINOPRIL 5 MG PO TABS: 5.0000 mg | ORAL_TABLET | Freq: Every day | ORAL | 1 refills | Status: DC

## 2020-04-03 MED ORDER — ATORVASTATIN CALCIUM 40 MG PO TABS: 40.0000 mg | ORAL_TABLET | Freq: Every day | ORAL | 1 refills | Status: DC

## 2020-04-03 NOTE — Progress Notes (Signed)
Date:  04/03/2020   Name:  Troy Rodriguez   DOB:  [birth year]   MRN:  [id number]   Chief Complaint: Diabetes and Flu Vaccine  Diabetes He presents for his follow-up diabetic visit. He has type 2 diabetes mellitus. His disease course has been stable. There are no hypoglycemic associated symptoms. Pertinent negatives for hypoglycemia include no dizziness, headaches or nervousness/anxiousness. Pertinent negatives for diabetes include no blurred vision, no chest pain, no fatigue, no foot paresthesias, no foot ulcerations, no polydipsia, no polyphagia, no polyuria, no visual change, no weakness and no weight loss. There are no hypoglycemic complications. Symptoms are stable. There are no diabetic complications. Risk factors for coronary artery disease include dyslipidemia, male sex, hypertension and obesity. Current diabetic treatment includes oral agent (dual therapy) (actos/metformen). He is compliant with treatment most of the time. He is following a generally healthy diet. He participates in exercise intermittently. His breakfast blood glucose is taken between 8-9 am. His breakfast blood glucose range is generally 130-140 mg/dl. An ACE inhibitor/angiotensin II receptor blocker is being taken.    Lab Results  Component Value Date   CREATININE 1.37 (H) 12/08/2019   BUN 14 12/08/2019   NA 144 12/08/2019   K 4.6 12/08/2019   CL 107 (H) 12/08/2019   CO2 26 12/08/2019   Lab Results  Component Value Date   CHOL 136 12/08/2019   HDL 33 (L) 12/08/2019   LDLCALC 85 12/08/2019   TRIG 97 12/08/2019   CHOLHDL 4.9 04/01/2018   No results found for: TSH Lab Results  Component Value Date   HGBA1C 6.3 (H) 12/08/2019   Lab Results  Component Value Date   WBC 4.7 02/20/2015   HGB 15.1 02/20/2015   HCT 45.6 02/20/2015   MCV 91 02/20/2015   PLT 176 02/20/2015   Lab Results  Component Value Date   ALT 40 07/12/2019   AST 46 (H) 07/12/2019   ALKPHOS 98 07/12/2019   BILITOT 1.5 (H) 07/12/2019       Review of Systems  Constitutional: Negative for chills, fatigue, fever and weight loss.  HENT: Negative for drooling, ear discharge, ear pain and sore throat.   Eyes: Negative for blurred vision.  Respiratory: Negative for cough, shortness of breath and wheezing.   Cardiovascular: Negative for chest pain, palpitations and leg swelling.  Gastrointestinal: Negative for abdominal pain, blood in stool, constipation, diarrhea and nausea.  Endocrine: Negative for polydipsia, polyphagia and polyuria.  Genitourinary: Negative for dysuria, frequency, hematuria and urgency.  Musculoskeletal: Negative for back pain, myalgias and neck pain.  Skin: Negative for rash.  Allergic/Immunologic: Negative for environmental allergies.  Neurological: Negative for dizziness, weakness and headaches.  Hematological: Does not bruise/bleed easily.  Psychiatric/Behavioral: Negative for suicidal ideas. The patient is not nervous/anxious.     Patient Active Problem List   Diagnosis Date Noted  . Mixed hyperlipidemia 01/01/2017  . 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 H/O hypercholesterolemia 12/20/2014  . Flu vaccine need 12/20/2014  . Need for vaccination 12/20/2014  . Umbilical hernia without obstruction and without gangrene 12/20/2014  . Familial multiple lipoprotein-type hyperlipidemia 12/08/2014  . Alimentary obesity 12/08/2014  . Type 2 diabetes mellitus with complication, without long-term current use of insulin ([place]) 12/08/2014  . Essential (primary) hypertension 12/08/2014  . Blood in the urine 12/08/2014  . Routine general medical examination at a health care facility 12/08/2014  . Screening for depression 12/08/2014    No Known Allergies  Past Surgical History:  Procedure Laterality Date  .  CATARACT EXTRACTION Bilateral 2003  . CYSTOSCOPY W/ RETROGRADES Bilateral 02/26/2015   Procedure: CYSTOSCOPY WITH RETROGRADE PYELOGRAM;  Surgeon: Hollice Espy, MD;  Location: [facility];  Service:  Urology;  Laterality: Bilateral;  . HERNIA REPAIR  2015    Social History   Tobacco Use  . Smoking status: Former Smoker    Types: Cigarettes    Quit date: 02/23/1975    Years since quitting: 45.1  . Smokeless tobacco: Current User    Types: Chew  . Tobacco comment: smoked for only 6 mos out of his whole life  Vaping Use  . Vaping Use: Never used  Substance Use Topics  . Alcohol use: No    Alcohol/week: 0.0 standard drinks  . Drug use: No     Medication list has been reviewed and updated.  Current Meds  Medication Sig  . aspirin 81 MG EC tablet Take 1 tablet (81 mg total) by mouth daily. Swallow whole.  Marland Kitchen atorvastatin (LIPITOR) 40 MG tablet Take 1 tablet (40 mg total) by mouth daily.  Marland Kitchen lisinopril (ZESTRIL) 5 MG tablet Take 1 tablet (5 mg total) by mouth daily.  . metFORMIN (GLUCOPHAGE-XR) 500 MG 24 hr tablet TAKE 1 TABLET BY MOUTH EVERY DAY WITH BREAKFAST  . Omega-3 Fatty Acids (FISH OIL PO) Take 1 capsule by mouth daily. Reported on 08/08/2015  . pioglitazone (ACTOS) 15 MG tablet Take 1 tablet (15 mg total) by mouth daily.    PHQ 2/9 Scores 04/03/2020 12/08/2019 11/16/2019 08/23/2019  PHQ - 2 Score 0 0 0 0  PHQ- 9 Score 0 0 - 0    GAD 7 : Generalized Anxiety Score 04/03/2020 12/08/2019 08/23/2019  Nervous, Anxious, on Edge 0 0 0  Control/stop worrying 0 0 0  Worry too much - different things 0 0 0  Trouble relaxing 0 0 0  Restless 0 0 0  Easily annoyed or irritable 0 0 0  Afraid - awful might happen 0 0 0  Total GAD 7 Score 0 0 0    BP Readings from Last 3 Encounters:  04/03/20 130/70  12/08/19 120/64  08/23/19 130/80    Physical Exam Vitals and nursing note reviewed.  HENT:     Head: Normocephalic.     Right Ear: Tympanic membrane, ear canal and external ear normal. There is no impacted cerumen.     Left Ear: Tympanic membrane, ear canal and external ear normal. There is no impacted cerumen.     Nose: Nose normal. No congestion or rhinorrhea.     Mouth/Throat:       Mouth: Mucous membranes are moist.  Eyes:     General: No scleral icterus.       Right eye: No discharge.        Left eye: No discharge.     Conjunctiva/sclera: Conjunctivae normal.     Pupils: Pupils are equal, round, and reactive to light.  Neck:     Thyroid: No thyromegaly.     Vascular: No JVD.     Trachea: No tracheal deviation.  Cardiovascular:     Rate and Rhythm: Normal rate and regular rhythm.     Heart sounds: Normal heart sounds. No murmur heard.  No friction rub. No gallop.   Pulmonary:     Effort: No respiratory distress.     Breath sounds: Normal breath sounds. No wheezing, rhonchi or rales.  Abdominal:     General: Bowel sounds are normal.     Palpations: Abdomen is soft. There is no  mass.     Tenderness: There is no abdominal tenderness. There is no right CVA tenderness, left CVA tenderness, guarding or rebound.  Musculoskeletal:        General: No tenderness. Normal range of motion.     Cervical back: Normal range of motion and neck supple.  Lymphadenopathy:     Cervical: No cervical adenopathy.  Skin:    General: Skin is warm.     Capillary Refill: Capillary refill takes less than 2 seconds.     Coloration: Skin is not jaundiced or pale.     Findings: No bruising, erythema or rash.  Neurological:     Mental Status: He is alert and oriented to person, place, and time.     Cranial Nerves: No cranial nerve deficit.     Deep Tendon Reflexes: Reflexes are normal and symmetric.     Wt Readings from Last 3 Encounters:  04/03/20 278 lb (126.1 kg)  12/08/19 274 lb (124.3 kg)  08/23/19 275 lb (124.7 kg)    BP 130/70   Pulse 60   Ht 6\' 1"  (1.854 m)   Wt 278 lb (126.1 kg)   BMI 36.68 kg/m   Assessment and Plan: 1. Essential (primary) hypertension Chronic.  Controlled.  Stable.  Continue lisinopril 1 a day. - lisinopril (ZESTRIL) 5 MG tablet; Take 1 tablet (5 mg total) by mouth daily.  Dispense: 90 tablet; Refill: 1  2. Type 2 diabetes mellitus with  complication, without long-term current use of insulin ([place]) .  Controlled.  Stable.  Continue Metformin XR 500 mg and Actos 15 mg once a day. - metFORMIN (GLUCOPHAGE-XR) 500 MG 24 hr tablet; TAKE 1 TABLET BY MOUTH EVERY DAY WITH BREAKFAST  Dispense: 90 tablet; Refill: 1 - pioglitazone (ACTOS) 15 MG tablet; Take 1 tablet (15 mg total) by mouth daily.  Dispense: 90 tablet; Refill: 1  3. Familial multiple lipoprotein-type hyperlipidemia Chronic.  Controlled.  Stable.  Continue atorvastatin 40 mg once a day. - atorvastatin (LIPITOR) 40 MG tablet; Take 1 tablet (40 mg total) by mouth daily.  Dispense: 90 tablet; Refill: 1  4. Influenza vaccine needed Discussed and administered  5. Need for immunization against influenza As noted above. - Flu Vaccine QUAD High Dose(Fluad)

## 2020-04-27 LAB — HM DIABETES EYE EXAM

## 2020-07-24 ENCOUNTER — Ambulatory Visit: Payer: PPO | Admitting: Family Medicine

## 2020-09-03 ENCOUNTER — Encounter: Payer: Self-pay | Admitting: Family Medicine

## 2020-09-03 ENCOUNTER — Other Ambulatory Visit: Payer: Self-pay

## 2020-09-03 ENCOUNTER — Ambulatory Visit (INDEPENDENT_AMBULATORY_CARE_PROVIDER_SITE_OTHER): Payer: PPO | Admitting: Family Medicine

## 2020-09-03 VITALS — BP 120/70 | HR 60 | Ht 73.0 in | Wt 281.0 lb

## 2020-09-03 DIAGNOSIS — E118 Type 2 diabetes mellitus with unspecified complications: Secondary | ICD-10-CM

## 2020-09-03 MED ORDER — PIOGLITAZONE HCL 15 MG PO TABS: 15.0000 mg | ORAL_TABLET | Freq: Every day | ORAL | 1 refills | Status: DC

## 2020-09-03 MED ORDER — METFORMIN HCL ER 500 MG PO TB24
ORAL_TABLET | ORAL | 1 refills | Status: DC
Start: 2020-09-03 — End: 2021-04-19

## 2020-09-03 NOTE — Progress Notes (Signed)
Date:  09/03/2020   Name:  Troy Rodriguez   DOB:  1940-09-01   MRN:  [id number]   Chief Complaint: Diabetes  Diabetes He presents for his follow-up diabetic visit. He has type 2 diabetes mellitus. His disease course has been stable. There are no hypoglycemic associated symptoms. Pertinent negatives for hypoglycemia include no dizziness, headaches or nervousness/anxiousness. There are no diabetic associated symptoms. Pertinent negatives for diabetes include no blurred vision, no chest pain, no fatigue, no foot paresthesias, no foot ulcerations, no polydipsia, no polyphagia, no polyuria, no visual change, no weakness and no weight loss. There are no hypoglycemic complications. There are no diabetic complications. Current diabetic treatment includes oral agent (dual therapy) (actos/metformen). He is compliant with treatment all of the time. His weight is stable. He is following a generally healthy diet. Meal planning includes avoidance of concentrated sweets and carbohydrate counting. He participates in exercise daily. There is no change in his home blood glucose trend. His breakfast blood glucose range is generally 130-140 mg/dl. An ACE inhibitor/angiotensin II receptor blocker is being taken.    Lab Results  Component Value Date   CREATININE 1.37 (H) 12/08/2019   BUN 14 12/08/2019   NA 144 12/08/2019   K 4.6 12/08/2019   CL 107 (H) 12/08/2019   CO2 26 12/08/2019   Lab Results  Component Value Date   CHOL 136 12/08/2019   HDL 33 (L) 12/08/2019   LDLCALC 85 12/08/2019   TRIG 97 12/08/2019   CHOLHDL 4.9 04/01/2018   No results found for: TSH Lab Results  Component Value Date   HGBA1C 6.3 (H) 12/08/2019   Lab Results  Component Value Date   WBC 4.7 02/20/2015   HGB 15.1 02/20/2015   HCT 45.6 02/20/2015   MCV 91 02/20/2015   PLT 176 02/20/2015   Lab Results  Component Value Date   ALT 40 07/12/2019   AST 46 (H) 07/12/2019   ALKPHOS 98 07/12/2019   BILITOT 1.5 (H) 07/12/2019      Review of Systems  Constitutional: Negative for chills, fatigue, fever and weight loss.  HENT: Negative for drooling, ear discharge, ear pain and sore throat.   Eyes: Negative for blurred vision.  Respiratory: Negative for cough, shortness of breath and wheezing.   Cardiovascular: Negative for chest pain, palpitations and leg swelling.  Gastrointestinal: Negative for abdominal pain, blood in stool, constipation, diarrhea and nausea.  Endocrine: Negative for polydipsia, polyphagia and polyuria.  Genitourinary: Negative for dysuria, frequency, hematuria and urgency.  Musculoskeletal: Negative for back pain, myalgias and neck pain.  Skin: Negative for rash.  Allergic/Immunologic: Negative for environmental allergies.  Neurological: Negative for dizziness, weakness and headaches.  Hematological: Does not bruise/bleed easily.  Psychiatric/Behavioral: Negative for suicidal ideas. The patient is not nervous/anxious.     Patient Active Problem List   Diagnosis Date Noted  . Mixed hyperlipidemia 01/01/2017  . 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 H/O hypercholesterolemia 12/20/2014  . Flu vaccine need 12/20/2014  . Need for vaccination 12/20/2014  . Umbilical hernia without obstruction and without gangrene 12/20/2014  . Familial multiple lipoprotein-type hyperlipidemia 12/08/2014  . Alimentary obesity 12/08/2014  . Type 2 diabetes mellitus with complication, without long-term current use of insulin ([place]) 12/08/2014  . Essential (primary) hypertension 12/08/2014  . Blood in the urine 12/08/2014  . Routine general medical examination at a health care facility 12/08/2014  . Screening for depression 12/08/2014    No Known Allergies  Past Surgical History:  Procedure Laterality  Date  . CATARACT EXTRACTION Bilateral 2003  . CYSTOSCOPY W/ RETROGRADES Bilateral 02/26/2015   Procedure: CYSTOSCOPY WITH RETROGRADE PYELOGRAM;  Surgeon: Hollice Espy, MD;  Location: [facility];  Service:  Urology;  Laterality: Bilateral;  . HERNIA REPAIR  2015    Social History   Tobacco Use  . Smoking status: Former Smoker    Types: Cigarettes    Quit date: 02/23/1975    Years since quitting: 45.5  . Smokeless tobacco: Current User    Types: Chew  . Tobacco comment: smoked for only 6 mos out of his whole life  Vaping Use  . Vaping Use: Never used  Substance Use Topics  . Alcohol use: No    Alcohol/week: 0.0 standard drinks  . Drug use: No     Medication list has been reviewed and updated.  Current Meds  Medication Sig  . aspirin 81 MG EC tablet Take 1 tablet (81 mg total) by mouth daily. Swallow whole.  Marland Kitchen atorvastatin (LIPITOR) 40 MG tablet Take 1 tablet (40 mg total) by mouth daily.  Marland Kitchen lisinopril (ZESTRIL) 5 MG tablet Take 1 tablet (5 mg total) by mouth daily.  . metFORMIN (GLUCOPHAGE-XR) 500 MG 24 hr tablet TAKE 1 TABLET BY MOUTH EVERY DAY WITH BREAKFAST  . Omega-3 Fatty Acids (FISH OIL PO) Take 1 capsule by mouth daily. Reported on 08/08/2015  . pioglitazone (ACTOS) 15 MG tablet Take 1 tablet (15 mg total) by mouth daily.    PHQ 2/9 Scores 04/03/2020 12/08/2019 11/16/2019 08/23/2019  PHQ - 2 Score 0 0 0 0  PHQ- 9 Score 0 0 - 0    GAD 7 : Generalized Anxiety Score 04/03/2020 12/08/2019 08/23/2019  Nervous, Anxious, on Edge 0 0 0  Control/stop worrying 0 0 0  Worry too much - different things 0 0 0  Trouble relaxing 0 0 0  Restless 0 0 0  Easily annoyed or irritable 0 0 0  Afraid - awful might happen 0 0 0  Total GAD 7 Score 0 0 0    BP Readings from Last 3 Encounters:  09/03/20 120/70  04/03/20 130/70  12/08/19 120/64    Physical Exam Vitals and nursing note reviewed.  HENT:     Head: Normocephalic.     Right Ear: Tympanic membrane and external ear normal.     Left Ear: Tympanic membrane and external ear normal.     Nose: Nose normal.     Mouth/Throat:     Mouth: Oropharynx is clear and moist.  Eyes:     General: No scleral icterus.       Right eye: No  discharge.        Left eye: No discharge.     Extraocular Movements: EOM normal.     Conjunctiva/sclera: Conjunctivae normal.     Pupils: Pupils are equal, round, and reactive to light.  Neck:     Thyroid: No thyromegaly.     Vascular: No JVD.     Trachea: No tracheal deviation.  Cardiovascular:     Rate and Rhythm: Normal rate and regular rhythm.     Pulses: Intact distal pulses.     Heart sounds: Normal heart sounds, S1 normal and S2 normal. No murmur heard.  No systolic murmur is present.  No diastolic murmur is present. No friction rub. No gallop. No S3 or S4 sounds.   Pulmonary:     Effort: No respiratory distress.     Breath sounds: Normal breath sounds. No wheezing or rales.  Abdominal:     General: Bowel sounds are normal.     Palpations: Abdomen is soft. There is no hepatosplenomegaly or mass.     Tenderness: There is no abdominal tenderness. There is no CVA tenderness, guarding or rebound.  Musculoskeletal:        General: No tenderness or edema. Normal range of motion.     Cervical back: Normal range of motion and neck supple.     Right lower leg: No edema.     Left lower leg: No edema.  Lymphadenopathy:     Cervical: No cervical adenopathy.  Skin:    General: Skin is warm.     Findings: No rash.  Neurological:     Mental Status: He is alert and oriented to person, place, and time.     Cranial Nerves: No cranial nerve deficit.     Deep Tendon Reflexes: Strength normal and reflexes are normal and symmetric.     Wt Readings from Last 3 Encounters:  09/03/20 281 lb (127.5 kg)  04/03/20 278 lb (126.1 kg)  12/08/19 274 lb (124.3 kg)    BP 120/70   Pulse 60   Ht 6\' 1"  (1.854 m)   Wt 281 lb (127.5 kg)   BMI 37.07 kg/m   Assessment and Plan: 1. Type 2 diabetes mellitus with complication, without long-term current use of insulin ([facility]) Chronic.  Controlled.  Stable.  Fasting blood sugars in the 130 range on the average.  We will check an A1c microalbuminuria  and renal function panel today and will anticipate continuing with Metformin XR 500 mg and Actos 15 mg once a day.  We'll recheck patient in 4 months. - HgB A1c - Microalbumin, urine - Renal Function Panel - metFORMIN (GLUCOPHAGE-XR) 500 MG 24 hr tablet; TAKE 1 TABLET BY MOUTH EVERY DAY WITH BREAKFAST  Dispense: 90 tablet; Refill: 1 - pioglitazone (ACTOS) 15 MG tablet; Take 1 tablet (15 mg total) by mouth daily.  Dispense: 90 tablet; Refill: 1

## 2020-09-04 LAB — RENAL FUNCTION PANEL
Albumin: 4.2 g/dL (ref 3.6–4.6)
BUN/Creatinine Ratio: 10 (ref 10–24)
BUN: 15 mg/dL (ref 8–27)
CO2: 26 mmol/L (ref 20–29)
Calcium: 9.5 mg/dL (ref 8.6–10.2)
Chloride: 103 mmol/L (ref 96–106)
Creatinine, Ser: 1.47 mg/dL — ABNORMAL HIGH (ref 0.76–1.27)
GFR calc Af Amer: 51 mL/min/{1.73_m2} — ABNORMAL LOW (ref >59)
GFR calc non Af Amer: 44 mL/min/{1.73_m2} — ABNORMAL LOW (ref >59)
Glucose: 139 mg/dL — ABNORMAL HIGH (ref 65–99)
Phosphorus: 2.9 mg/dL (ref 2.8–4.1)
Potassium: 4.7 mmol/L (ref 3.5–5.2)
Sodium: 142 mmol/L (ref 134–144)

## 2020-09-04 LAB — HEMOGLOBIN A1C
Est. average glucose Bld gHb Est-mCnc: 134 mg/dL
Hgb A1c MFr Bld: 6.3 % — ABNORMAL HIGH (ref 4.8–5.6)

## 2020-09-04 LAB — MICROALBUMIN, URINE: Microalbumin, Urine: 18.6 ug/mL

## 2020-11-16 ENCOUNTER — Other Ambulatory Visit: Payer: Self-pay | Admitting: Family Medicine

## 2020-11-16 DIAGNOSIS — I10 Essential (primary) hypertension: Secondary | ICD-10-CM

## 2020-11-19 ENCOUNTER — Ambulatory Visit (INDEPENDENT_AMBULATORY_CARE_PROVIDER_SITE_OTHER): Payer: PPO

## 2020-11-19 ENCOUNTER — Other Ambulatory Visit: Payer: Self-pay

## 2020-11-19 VITALS — BP 120/56 | HR 56 | Temp 98.2°F | Resp 15 | Ht 73.0 in | Wt 280.8 lb

## 2020-11-19 DIAGNOSIS — Z0000 Encounter for general adult medical examination without abnormal findings: Secondary | ICD-10-CM

## 2020-11-19 NOTE — Progress Notes (Signed)
Subjective:   Troy Rodriguez is a 80 y.o. male who presents for Medicare Annual/Subsequent preventive examination.  Review of Systems     Cardiac Risk Factors include: advanced age (>45men, >18 women);diabetes mellitus;dyslipidemia;male gender;hypertension;obesity (BMI >30kg/m2)     Objective:    Today's Vitals   11/19/20 1129  BP: (!) 120/56  Pulse: (!) 56  Resp: 15  Temp: 98.2 F (36.8 C)  TempSrc: Oral  SpO2: 95%  Weight: 280 lb 12.8 oz (127.4 kg)  Height: 6\' 1"  (1.854 m)   Body mass index is 37.05 kg/m.  Advanced Directives 11/19/2020 01/01/2017 12/29/2016 02/26/2015  Does Patient Have a Medical Advance Directive? Yes Yes No;Yes No  Type of Paramedic of [place];Living will - [place];Living will -  Copy of [place] in Chart? Yes - validated most recent copy scanned in chart (See row information) Yes No - copy requested -  Would patient like information on creating a medical advance directive? - - Yes (MAU/Ambulatory/Procedural Areas - Information given) -    Current Medications (verified) Outpatient Encounter Medications as of 11/19/2020  Medication Sig  . aspirin 81 MG EC tablet Take 1 tablet (81 mg total) by mouth daily. Swallow whole.  Marland Kitchen atorvastatin (LIPITOR) 40 MG tablet Take 1 tablet (40 mg total) by mouth daily.  Marland Kitchen lisinopril (ZESTRIL) 5 MG tablet TAKE 1 TABLET BY MOUTH EVERY DAY  . metFORMIN (GLUCOPHAGE-XR) 500 MG 24 hr tablet TAKE 1 TABLET BY MOUTH EVERY DAY WITH BREAKFAST  . Omega-3 Fatty Acids (FISH OIL PO) Take 1 capsule by mouth daily. Reported on 08/08/2015  . pioglitazone (ACTOS) 15 MG tablet Take 1 tablet (15 mg total) by mouth daily.   No facility-administered encounter medications on file as of 11/19/2020.    Allergies (verified) Patient has no known allergies.   History: Past Medical History:  Diagnosis Date  . Arthritis   . Diabetes ([facility])   . Hematuria   . HLD (hyperlipidemia)    . Umbilical hernia    Past Surgical History:  Procedure Laterality Date  . CATARACT EXTRACTION Bilateral 2003  . CYSTOSCOPY W/ RETROGRADES Bilateral 02/26/2015   Procedure: CYSTOSCOPY WITH RETROGRADE PYELOGRAM;  Surgeon: Hollice Espy, MD;  Location: [facility] ORS;  Service: Urology;  Laterality: Bilateral;  . HERNIA REPAIR  2015   Family History  Problem Relation Age of Onset  . Prostate cancer Brother   . Kidney disease Neg Hx    Social History   Socioeconomic History  . Marital status: Married    Spouse name: Not on file  . Number of children: Not on file  . Years of education: Not on file  . Highest education level: Not on file  Occupational History  . Not on file  Tobacco Use  . Smoking status: Former Smoker    Types: Cigarettes    Quit date: 02/23/1975    Years since quitting: 45.7  . Smokeless tobacco: Current User    Types: Chew  . Tobacco comment: smoked for only 6 mos out of his whole life  Vaping Use  . Vaping Use: Never used  Substance and Sexual Activity  . Alcohol use: No    Alcohol/week: 0.0 standard drinks  . Drug use: No  . Sexual activity: Not Currently  Other Topics Concern  . Not on file  Social History Narrative   Caffeine use:2 per day.   Social Determinants of Health   Financial Resource Strain: Low Risk   .  Difficulty of Paying Living Expenses: Not hard at all  Food Insecurity: No Food Insecurity  . Worried About Charity fundraiser in the Last Year: Never true  . Ran Out of Food in the Last Year: Never true  Transportation Needs: No Transportation Needs  . Lack of Transportation (Medical): No  . Lack of Transportation (Non-Medical): No  Physical Activity: Sufficiently Active  . Days of Exercise per Week: 7 days  . Minutes of Exercise per Session: 30 min  Stress: No Stress Concern Present  . Feeling of Stress : Not at all  Social Connections: Moderately Isolated  . Frequency of Communication with Friends and Family: More than three times  a week  . Frequency of Social Gatherings with Friends and Family: Three times a week  . Attends Religious Services: Never  . Active Member of Clubs or Organizations: No  . Attends Archivist Meetings: Never  . Marital Status: Married    Tobacco Counseling Ready to quit: Not Answered Counseling given: Not Answered Comment: smoked for only 6 mos out of his whole life   Clinical Intake:  Pre-visit preparation completed: Yes  Pain : No/denies pain     BMI - recorded: 37.05 Nutritional Status: BMI > 30  Obese Nutritional Risks: None Diabetes: Yes CBG done?: No Did pt. bring in CBG monitor from home?: No  How often do you need to have someone help you when you read instructions, pamphlets, or other written materials from your doctor or pharmacy?: 1 - Never  Nutrition Risk Assessment:  Has the patient had any N/V/D within the last 2 months?  No  Does the patient have any non-healing wounds?  No  Has the patient had any unintentional weight loss or weight gain?  No   Diabetes:  Is the patient diabetic?  Yes  If diabetic, was a CBG obtained today?  No  Did the patient bring in their glucometer from home?  No  How often do you monitor your CBG's? Every so often per patient.   Financial Strains and Diabetes Management:  Are you having any financial strains with the device, your supplies or your medication? No .  Does the patient want to be seen by Chronic Care Management for management of their diabetes?  No  Would the patient like to be referred to a Nutritionist or for Diabetic Management?  No   Diabetic Exams:  Diabetic Eye Exam: Completed 04/27/20 negative retinopathy.  Diabetic Foot Exam: Completed 12/08/19.   Interpreter Needed?: No  Information entered by :: Clemetine Marker LPN   Activities of Daily Living In your present state of health, do you have any difficulty performing the following activities: 11/19/2020  Hearing? Y  Comment has hearing aids but  does not wear them  Vision? N  Difficulty concentrating or making decisions? N  Walking or climbing stairs? N  Dressing or bathing? N  Doing errands, shopping? N  Preparing Food and eating ? N  Using the Toilet? N  In the past six months, have you accidently leaked urine? N  Do you have problems with loss of bowel control? N  Managing your Medications? N  Managing your Finances? N  Housekeeping or managing your Housekeeping? N  Some recent data might be hidden    Patient Care Team: Juline Patch, MD as PCP - General (Family Medicine)  Indicate any recent Medical Services you may have received from other than [facility] providers in the past year (date may be approximate).  Assessment:   This is a routine wellness examination for [place].  Hearing/Vision screen  Hearing Screening   125Hz  250Hz  500Hz  1000Hz  2000Hz  3000Hz  4000Hz  6000Hz  8000Hz   Right ear:           Left ear:           Comments: Pt states hearing difficulty, has a hearing aid but background noise is bothersome and he does not wear it  Vision Screening Comments: Annual vision screenings done at [employer] [facility]   Dietary issues and exercise activities discussed: Current Exercise Habits: Home exercise routine, Type of exercise: walking, Time (Minutes): 30, Frequency (Times/Week): 7, Weekly Exercise (Minutes/Week): 210, Intensity: Mild, Exercise limited by: None identified  Goals    . Increase water intake     Recommend drinking 4-5 glasses of water a day      Depression Screen PHQ 2/9 Scores 11/19/2020 04/03/2020 12/08/2019 11/16/2019 08/23/2019 07/12/2019 11/16/2018  PHQ - 2 Score 0 0 0 0 0 0 0  PHQ- 9 Score - 0 0 - 0 - 0    Fall Risk Fall Risk  11/19/2020 04/03/2020 12/08/2019 11/16/2019 08/23/2019  Falls in the past year? 0 0 0 0 0  Number falls in past yr: 0 - - 0 -  Injury with Fall? 0 - - 0 -  Risk for fall due to : No Fall Risks - - No Fall Risks -  Follow up Falls prevention discussed Falls evaluation completed  Falls evaluation completed Falls prevention discussed Falls evaluation completed    [place]:  Any stairs in or around the home? Yes  If so, are there any without handrails? No  Home free of loose throw rugs in walkways, pet beds, electrical cords, etc? Yes  Adequate lighting in your home to reduce risk of falls? Yes   ASSISTIVE DEVICES UTILIZED TO PREVENT FALLS:  Life alert? No  Use of a cane, walker or w/c? No  Grab bars in the bathroom? Yes  Shower chair or bench in shower? No  Elevated toilet seat or a handicapped toilet? Yes   TIMED UP AND GO:  Was the test performed? Yes .  Length of time to ambulate 10 feet: 5 sec.   Gait steady and fast without use of assistive device  Cognitive Function:     6CIT Screen 11/19/2020 12/29/2016  What Year? 0 points 0 points  What month? 0 points 0 points  What time? 0 points 0 points  Count back from 20 0 points 0 points  Months in reverse 0 points 0 points  Repeat phrase 0 points 0 points  Total Score 0 0    Immunizations Immunization History  Administered Date(s) Administered  . Fluad Quad(high Dose 65+) 05/31/2019, 04/03/2020  . Influenza, High Dose Seasonal PF 08/04/2017, 04/01/2018  . Influenza,inj,Quad PF,6+ Mos 07/03/2016  . Influenza-Unspecified 05/22/2014  . PFIZER(Purple Top)SARS-COV-2 Vaccination 08/16/2019, 09/10/2019  . Pneumococcal Conjugate-13 11/13/2014  . Pneumococcal Polysaccharide-23 12/29/2016  . Tdap 08/27/2015  . Zoster 07/28/2013  . Zoster Recombinat (Shingrix) 03/11/2020, 05/09/2020    TDAP status: Up to date  Flu Vaccine status: Up to date  Pneumococcal vaccine status: Up to date  Covid-19 vaccine status: Completed vaccines  Qualifies for Shingles Vaccine? Yes   Zostavax completed Yes   Shingrix Completed?: Yes  Screening Tests Health Maintenance  Topic Date Due  . COVID-19 Vaccine (3 - Booster for Pfizer series) 03/09/2020  . FOOT EXAM  12/07/2020  .  INFLUENZA VACCINE  02/25/2021  .  HEMOGLOBIN A1C  03/03/2021  . OPHTHALMOLOGY EXAM  04/27/2021  . TETANUS/TDAP  08/26/2025  . PNA vac Low Risk Adult  Completed  . HPV VACCINES  Aged Out    Health Maintenance  Health Maintenance Due  Topic Date Due  . COVID-19 Vaccine (3 - Booster for Pfizer series) 03/09/2020    Colorectal cancer screening: No longer required.   Lung Cancer Screening: (Low Dose CT Chest recommended if Age 49-80 years, 30 pack-year currently smoking OR have quit w/in 15years.) does not qualify.   Additional Screening:  Hepatitis C Screening: does not qualify.   Vision Screening: Recommended annual ophthalmology exams for early detection of glaucoma and other disorders of the eye. Is the patient up to date with their annual eye exam?  Yes  Who is the provider or what is the name of the office in which the patient attends annual eye exams? [facility]  Dental Screening: Recommended annual dental exams for proper oral hygiene  Community Resource Referral / Chronic Care Management: CRR required this visit?  No   CCM required this visit?  No      Plan:     I have personally reviewed and noted the following in the patient's chart:   . Medical and social history . Use of alcohol, tobacco or illicit drugs  . Current medications and supplements . Functional ability and status . Nutritional status . Physical activity . Advanced directives . List of other physicians . Hospitalizations, surgeries, and ER visits in previous 12 months . Vitals . Screenings to include cognitive, depression, and falls . Referrals and appointments  In addition, I have reviewed and discussed with patient certain preventive protocols, quality metrics, and best practice recommendations. A written personalized care plan for preventive services as well as general preventive health recommendations were provided to patient.     Clemetine Marker, LPN   [id number]/[id number]   Nurse Notes: none

## 2020-11-19 NOTE — Patient Instructions (Signed)
Mr. Troy Rodriguez , Thank you for taking time to come for your Medicare Wellness Visit. I appreciate your ongoing commitment to your health goals. Please review the following plan we discussed and let me know if I can assist you in the future.   Screening recommendations/referrals: Colonoscopy: no longer required Recommended yearly ophthalmology/optometry visit for glaucoma screening and checkup Recommended yearly dental visit for hygiene and checkup  Vaccinations: Influenza vaccine: done 04/03/20 Pneumococcal vaccine: done 12/29/16 Tdap vaccine: done 08/27/15 Shingles vaccine: done 03/11/20 & 05/09/20   Covid-19: done 08/16/19 & 09/10/19  Conditions/risks identified: Keep up the great work!  Next appointment: Follow up in one year for your annual wellness visit.   Preventive Care 82 Years and Older, Male Preventive care refers to lifestyle choices and visits with your health care provider that can promote health and wellness. What does preventive care include?  A yearly physical exam. This is also called an annual well check.  Dental exams once or twice a year.  Routine eye exams. Ask your health care provider how often you should have your eyes checked.  Personal lifestyle choices, including:  Daily care of your teeth and gums.  Regular physical activity.  Eating a healthy diet.  Avoiding tobacco and drug use.  Limiting alcohol use.  Practicing safe sex.  Taking low doses of aspirin every day.  Taking vitamin and mineral supplements as recommended by your health care provider. What happens during an annual well check? The services and screenings done by your health care provider during your annual well check will depend on your age, overall health, lifestyle risk factors, and family history of disease. Counseling  Your health care provider may ask you questions about your:  Alcohol use.  Tobacco use.  Drug use.  Emotional well-being.  Home and relationship  well-being.  Sexual activity.  Eating habits.  History of falls.  Memory and ability to understand (cognition).  Work and work Statistician. Screening  You may have the following tests or measurements:  Height, weight, and BMI.  Blood pressure.  Lipid and cholesterol levels. These may be checked every 5 years, or more frequently if you are over 35 years old.  Skin check.  Lung cancer screening. You may have this screening every year starting at age 82 if you have a 30-pack-year history of smoking and currently smoke or have quit within the past 15 years.  Fecal occult blood test (FOBT) of the stool. You may have this test every year starting at age 82.  Flexible sigmoidoscopy or colonoscopy. You may have a sigmoidoscopy every 5 years or a colonoscopy every 10 years starting at age 82.  Prostate cancer screening. Recommendations will vary depending on your family history and other risks.  Hepatitis C blood test.  Hepatitis B blood test.  Sexually transmitted disease (STD) testing.  Diabetes screening. This is done by checking your blood sugar (glucose) after you have not eaten for a while (fasting). You may have this done every 1-3 years.  Abdominal aortic aneurysm (AAA) screening. You may need this if you are a current or former smoker.  Osteoporosis. You may be screened starting at age 82 if you are at high risk. Talk with your health care provider about your test results, treatment options, and if necessary, the need for more tests. Vaccines  Your health care provider may recommend certain vaccines, such as:  Influenza vaccine. This is recommended every year.  Tetanus, diphtheria, and acellular pertussis (Tdap, Td) vaccine. You may need a  Td booster every 10 years.  Zoster vaccine. You may need this after age 16.  Pneumococcal 13-valent conjugate (PCV13) vaccine. One dose is recommended after age 23.  Pneumococcal polysaccharide (PPSV23) vaccine. One dose is  recommended after age 60. Talk to your health care provider about which screenings and vaccines you need and how often you need them. This information is not intended to replace advice given to you by your health care provider. Make sure you discuss any questions you have with your health care provider. Document Released: 08/10/2015 Document Revised: 04/02/2016 Document Reviewed: 05/15/2015 Elsevier Interactive Patient Education  2017 [place] Prevention in the Home Falls can cause injuries. They can happen to people of all ages. There are many things you can do to make your home safe and to help prevent falls. What can I do on the outside of my home?  Regularly fix the edges of walkways and driveways and fix any cracks.  Remove anything that might make you trip as you walk through a door, such as a raised step or threshold.  Trim any bushes or trees on the path to your home.  Use bright outdoor lighting.  Clear any walking paths of anything that might make someone trip, such as rocks or tools.  Regularly check to see if handrails are loose or broken. Make sure that both sides of any steps have handrails.  Any raised decks and porches should have guardrails on the edges.  Have any leaves, snow, or ice cleared regularly.  Use sand or salt on walking paths during winter.  Clean up any spills in your garage right away. This includes oil or grease spills. What can I do in the bathroom?  Use night lights.  Install grab bars by the toilet and in the tub and shower. Do not use towel bars as grab bars.  Use non-skid mats or decals in the tub or shower.  If you need to sit down in the shower, use a plastic, non-slip stool.  Keep the floor dry. Clean up any water that spills on the floor as soon as it happens.  Remove soap buildup in the tub or shower regularly.  Attach bath mats securely with double-sided non-slip rug tape.  Do not have throw rugs and other things on  the floor that can make you trip. What can I do in the bedroom?  Use night lights.  Make sure that you have a light by your bed that is easy to reach.  Do not use any sheets or blankets that are too big for your bed. They should not hang down onto the floor.  Have a firm chair that has side arms. You can use this for support while you get dressed.  Do not have throw rugs and other things on the floor that can make you trip. What can I do in the kitchen?  Clean up any spills right away.  Avoid walking on wet floors.  Keep items that you use a lot in easy-to-reach places.  If you need to reach something above you, use a strong step stool that has a grab bar.  Keep electrical cords out of the way.  Do not use floor polish or wax that makes floors slippery. If you must use wax, use non-skid floor wax.  Do not have throw rugs and other things on the floor that can make you trip. What can I do with my stairs?  Do not leave any items on the stairs.  Make sure that there are handrails on both sides of the stairs and use them. Fix handrails that are broken or loose. Make sure that handrails are as long as the stairways.  Check any carpeting to make sure that it is firmly attached to the stairs. Fix any carpet that is loose or worn.  Avoid having throw rugs at the top or bottom of the stairs. If you do have throw rugs, attach them to the floor with carpet tape.  Make sure that you have a light switch at the top of the stairs and the bottom of the stairs. If you do not have them, ask someone to add them for you. What else can I do to help prevent falls?  Wear shoes that:  Do not have high heels.  Have rubber bottoms.  Are comfortable and fit you well.  Are closed at the toe. Do not wear sandals.  If you use a stepladder:  Make sure that it is fully opened. Do not climb a closed stepladder.  Make sure that both sides of the stepladder are locked into place.  Ask someone to  hold it for you, if possible.  Clearly mark and make sure that you can see:  Any grab bars or handrails.  First and last steps.  Where the edge of each step is.  Use tools that help you move around (mobility aids) if they are needed. These include:  Canes.  Walkers.  Scooters.  Crutches.  Turn on the lights when you go into a dark area. Replace any light bulbs as soon as they burn out.  Set up your furniture so you have a clear path. Avoid moving your furniture around.  If any of your floors are uneven, fix them.  If there are any pets around you, be aware of where they are.  Review your medicines with your doctor. Some medicines can make you feel dizzy. This can increase your chance of falling. Ask your doctor what other things that you can do to help prevent falls. This information is not intended to replace advice given to you by your health care provider. Make sure you discuss any questions you have with your health care provider. Document Released: 05/10/2009 Document Revised: 12/20/2015 Document Reviewed: 08/18/2014 Elsevier Interactive Patient Education  2017 [employer].

## 2020-12-17 ENCOUNTER — Encounter: Payer: Self-pay | Admitting: Family Medicine

## 2020-12-17 ENCOUNTER — Other Ambulatory Visit: Payer: Self-pay

## 2020-12-17 ENCOUNTER — Ambulatory Visit (INDEPENDENT_AMBULATORY_CARE_PROVIDER_SITE_OTHER): Payer: PPO | Admitting: Family Medicine

## 2020-12-17 VITALS — BP 100/62 | HR 60 | Ht 73.0 in | Wt 278.0 lb

## 2020-12-17 DIAGNOSIS — E7849 Other hyperlipidemia: Secondary | ICD-10-CM | POA: Diagnosis not present

## 2020-12-17 DIAGNOSIS — E119 Type 2 diabetes mellitus without complications: Secondary | ICD-10-CM | POA: Diagnosis not present

## 2020-12-17 DIAGNOSIS — I10 Essential (primary) hypertension: Secondary | ICD-10-CM | POA: Diagnosis not present

## 2020-12-17 DIAGNOSIS — Z961 Presence of intraocular lens: Secondary | ICD-10-CM | POA: Diagnosis not present

## 2020-12-17 DIAGNOSIS — E118 Type 2 diabetes mellitus with unspecified complications: Secondary | ICD-10-CM

## 2020-12-17 DIAGNOSIS — R69 Illness, unspecified: Secondary | ICD-10-CM

## 2020-12-17 DIAGNOSIS — H524 Presbyopia: Secondary | ICD-10-CM | POA: Diagnosis not present

## 2020-12-17 DIAGNOSIS — I700 Atherosclerosis of aorta: Secondary | ICD-10-CM

## 2020-12-17 DIAGNOSIS — E089 Diabetes mellitus due to underlying condition without complications: Secondary | ICD-10-CM | POA: Diagnosis not present

## 2020-12-17 MED ORDER — LISINOPRIL 5 MG PO TABS: 5.0000 mg | ORAL_TABLET | Freq: Every day | ORAL | 1 refills | Status: DC

## 2020-12-17 MED ORDER — ATORVASTATIN CALCIUM 40 MG PO TABS: 40.0000 mg | ORAL_TABLET | Freq: Every day | ORAL | 1 refills | Status: DC

## 2020-12-17 NOTE — Progress Notes (Signed)
Date:  12/17/2020   Name:  Troy Rodriguez   DOB:  Apr 23, 1941   MRN:  [id number]   Chief Complaint: Hypertension, Hyperlipidemia, and Diabetes  Hypertension This is a chronic problem. The current episode started more than 1 year ago. The problem has been gradually improving since onset. The problem is controlled. Pertinent negatives include no anxiety, blurred vision, chest pain, headaches, malaise/fatigue, neck pain, orthopnea, palpitations, peripheral edema, PND, shortness of breath or sweats. There are no associated agents to hypertension. Risk factors for coronary artery disease include diabetes mellitus, dyslipidemia, male gender and obesity. Past treatments include ACE inhibitors. The current treatment provides moderate improvement. There are no compliance problems.  There is no history of angina, kidney disease, CAD/MI, CVA, heart failure, left ventricular hypertrophy, PVD or retinopathy. There is no history of chronic renal disease, a hypertension causing med or renovascular disease.  Hyperlipidemia This is a chronic problem. The current episode started more than 1 year ago. The problem is controlled. Recent lipid tests were reviewed and are normal. He has no history of chronic renal disease, diabetes, hypothyroidism, liver disease, obesity or nephrotic syndrome. Pertinent negatives include no chest pain, focal sensory loss, focal weakness, leg pain, myalgias or shortness of breath. The current treatment provides moderate improvement of lipids. Risk factors for coronary artery disease include male sex, dyslipidemia, diabetes mellitus, hypertension and obesity.  Diabetes He presents for his follow-up diabetic visit. He has type 2 diabetes mellitus. His disease course has been stable. Pertinent negatives for hypoglycemia include no confusion, dizziness, headaches, nervousness/anxiousness, seizures or sweats. Pertinent negatives for diabetes include no blurred vision, no chest pain, no fatigue, no  foot paresthesias, no foot ulcerations, no polydipsia, no polyphagia, no polyuria, no visual change, no weakness and no weight loss. There are no hypoglycemic complications. Symptoms are stable. There are no diabetic complications. Pertinent negatives for diabetic complications include no CVA, PVD or retinopathy. Risk factors for coronary artery disease include diabetes mellitus, dyslipidemia, male sex and hypertension. Current diabetic treatment includes oral agent (dual therapy). He is compliant with treatment all of the time. His weight is stable. He is following a generally healthy diet. His breakfast blood glucose is taken between 8-9 am. His breakfast blood glucose range is generally 130-140 mg/dl. An ACE inhibitor/angiotensin II receptor blocker is being taken.    Lab Results  Component Value Date   CREATININE 1.47 (H) 09/03/2020   BUN 15 09/03/2020   NA 142 09/03/2020   K 4.7 09/03/2020   CL 103 09/03/2020   CO2 26 09/03/2020   Lab Results  Component Value Date   CHOL 136 12/08/2019   HDL 33 (L) 12/08/2019   LDLCALC 85 12/08/2019   TRIG 97 12/08/2019   CHOLHDL 4.9 04/01/2018   No results found for: TSH Lab Results  Component Value Date   HGBA1C 6.3 (H) 09/03/2020   Lab Results  Component Value Date   WBC 4.7 02/20/2015   HGB 15.1 02/20/2015   HCT 45.6 02/20/2015   MCV 91 02/20/2015   PLT 176 02/20/2015   Lab Results  Component Value Date   ALT 40 07/12/2019   AST 46 (H) 07/12/2019   ALKPHOS 98 07/12/2019   BILITOT 1.5 (H) 07/12/2019     Review of Systems  Constitutional: Negative for chills, fatigue, fever, malaise/fatigue and weight loss.  HENT: Negative for drooling, ear discharge, ear pain and sore throat.   Eyes: Negative for blurred vision.  Respiratory: Negative for cough, shortness of breath  and wheezing.   Cardiovascular: Negative for chest pain, palpitations, orthopnea, leg swelling and PND.  Gastrointestinal: Negative for abdominal pain, blood in  stool, constipation, diarrhea and nausea.  Endocrine: Negative for polydipsia, polyphagia and polyuria.  Genitourinary: Negative for dysuria, frequency, hematuria and urgency.  Musculoskeletal: Negative for back pain, myalgias and neck pain.  Skin: Negative for rash.  Allergic/Immunologic: Negative for environmental allergies.  Neurological: Negative for dizziness, focal weakness, seizures, weakness and headaches.  Hematological: Does not bruise/bleed easily.  Psychiatric/Behavioral: Negative for confusion and suicidal ideas. The patient is not nervous/anxious.     Patient Active Problem List   Diagnosis Date Noted  . Mixed hyperlipidemia 01/01/2017  . 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 H/O hypercholesterolemia 12/20/2014  . Flu vaccine need 12/20/2014  . Need for vaccination 12/20/2014  . Umbilical hernia without obstruction and without gangrene 12/20/2014  . Familial multiple lipoprotein-type hyperlipidemia 12/08/2014  . Alimentary obesity 12/08/2014  . Type 2 diabetes mellitus with complication, without long-term current use of insulin ([place]) 12/08/2014  . Essential (primary) hypertension 12/08/2014  . Blood in the urine 12/08/2014  . Routine general medical examination at a health care facility 12/08/2014  . Screening for depression 12/08/2014    No Known Allergies  Past Surgical History:  Procedure Laterality Date  . CATARACT EXTRACTION Bilateral 2003  . CYSTOSCOPY W/ RETROGRADES Bilateral 02/26/2015   Procedure: CYSTOSCOPY WITH RETROGRADE PYELOGRAM;  Surgeon: Hollice Espy, MD;  Location: [facility] ORS;  Service: Urology;  Laterality: Bilateral;  . HERNIA REPAIR  2015    Social History   Tobacco Use  . Smoking status: Former Smoker    Types: Cigarettes    Quit date: 02/23/1975    Years since quitting: 45.8  . Smokeless tobacco: Current User    Types: Chew  . Tobacco comment: smoked for only 6 mos out of his whole life  Vaping Use  . Vaping Use: Never used   Substance Use Topics  . Alcohol use: No    Alcohol/week: 0.0 standard drinks  . Drug use: No     Medication list has been reviewed and updated.  Current Meds  Medication Sig  . aspirin 81 MG EC tablet Take 1 tablet (81 mg total) by mouth daily. Swallow whole.  Marland Kitchen atorvastatin (LIPITOR) 40 MG tablet Take 1 tablet (40 mg total) by mouth daily.  Marland Kitchen lisinopril (ZESTRIL) 5 MG tablet TAKE 1 TABLET BY MOUTH EVERY DAY  . metFORMIN (GLUCOPHAGE-XR) 500 MG 24 hr tablet TAKE 1 TABLET BY MOUTH EVERY DAY WITH BREAKFAST  . Omega-3 Fatty Acids (FISH OIL PO) Take 1 capsule by mouth daily. Reported on 08/08/2015  . pioglitazone (ACTOS) 15 MG tablet Take 1 tablet (15 mg total) by mouth daily.    PHQ 2/9 Scores 12/17/2020 11/19/2020 04/03/2020 12/08/2019  PHQ - 2 Score 0 0 0 0  PHQ- 9 Score 0 - 0 0    GAD 7 : Generalized Anxiety Score 12/17/2020 04/03/2020 12/08/2019 08/23/2019  Nervous, Anxious, on Edge 0 0 0 0  Control/stop worrying 0 0 0 0  Worry too much - different things 0 0 0 0  Trouble relaxing 0 0 0 0  Restless 0 0 0 0  Easily annoyed or irritable 0 0 0 0  Afraid - awful might happen 0 0 0 0  Total GAD 7 Score 0 0 0 0    BP Readings from Last 3 Encounters:  12/17/20 100/62  11/19/20 (!) 120/56  09/03/20 120/70    Physical Exam Vitals  and nursing note reviewed.  HENT:     Head: Normocephalic.     Jaw: There is normal jaw occlusion.     Right Ear: Tympanic membrane, ear canal and external ear normal. Decreased hearing noted.     Left Ear: Tympanic membrane, ear canal and external ear normal. Decreased hearing noted.     Nose: Nose normal. No congestion or rhinorrhea.     Right Turbinates: Not swollen.     Left Turbinates: Not swollen.     Mouth/Throat:     Mouth: Mucous membranes are moist.     Dentition: Normal dentition.     Tongue: No lesions.     Palate: No mass.     Pharynx: Oropharynx is clear.  Eyes:     General: No scleral icterus.       Right eye: No discharge.         Left eye: No discharge.     Conjunctiva/sclera: Conjunctivae normal.     Pupils: Pupils are equal, round, and reactive to light.  Neck:     Thyroid: No thyromegaly.     Vascular: No JVD.     Trachea: No tracheal deviation.  Cardiovascular:     Rate and Rhythm: Normal rate and regular rhythm.     Pulses:          Dorsalis pedis pulses are 2+ on the right side and 2+ on the left side.       Posterior tibial pulses are 2+ on the right side and 2+ on the left side.     Heart sounds: Normal heart sounds, S1 normal and S2 normal. No murmur heard.  No systolic murmur is present.  No diastolic murmur is present. No friction rub. No gallop. No S3 or S4 sounds.   Pulmonary:     Effort: No respiratory distress.     Breath sounds: Normal breath sounds. No decreased breath sounds, wheezing, rhonchi or rales.  Abdominal:     General: Bowel sounds are normal.     Palpations: Abdomen is soft. There is no hepatomegaly, splenomegaly or mass.     Tenderness: There is no abdominal tenderness. There is no guarding or rebound.  Musculoskeletal:        General: No tenderness. Normal range of motion.     Cervical back: Normal range of motion and neck supple.     Right foot: Normal range of motion. No deformity.     Left foot: Normal range of motion. No deformity.  Feet:     Right foot:     Protective Sensation: 10 sites tested. 10 sites sensed.     Skin integrity: Callus present. No ulcer, blister, skin breakdown, erythema, warmth, dry skin or fissure.     Toenail Condition: Right toenails are abnormally thick.     Left foot:     Protective Sensation: 10 sites tested. 10 sites sensed.     Skin integrity: Callus present. No ulcer, blister, skin breakdown, erythema, warmth, dry skin or fissure.     Toenail Condition: Left toenails are abnormally thick.  Lymphadenopathy:     Cervical: No cervical adenopathy.  Skin:    General: Skin is warm.     Findings: No rash.  Neurological:     Mental Status: He  is alert and oriented to person, place, and time.     Cranial Nerves: No cranial nerve deficit.     Deep Tendon Reflexes: Reflexes are normal and symmetric.     Wt Readings from  Last 3 Encounters:  12/17/20 278 lb (126.1 kg)  11/19/20 280 lb 12.8 oz (127.4 kg)  09/03/20 281 lb (127.5 kg)    BP 100/62   Pulse 60   Ht 6\' 1"  (1.854 m)   Wt 278 lb (126.1 kg)   BMI 36.68 kg/m   Assessment and Plan:  1. Type 2 diabetes mellitus with complication, without long-term current use of insulin (HCC) Chronic.  Controlled.  Stable.  Patient will continue metformin 500 mg XR once a day and pioglitazone 15 mg once a day.  Will check A1c. - HgB A1c  2. Essential (primary) hypertension Chronic.  Controlled.  Stable.  Blood pressure 100/62 and patient does not have any postural symptoms.  We will continue lisinopril 5 mg once a day. - lisinopril (ZESTRIL) 5 MG tablet; Take 1 tablet (5 mg total) by mouth daily.  Dispense: 90 tablet; Refill: 1  3. Familial multiple lipoprotein-type hyperlipidemia Chronic.  Controlled.  Stable.  Continue atorvastatin 40 mg once a day.  Will check lipid panel for current status of LDL. - atorvastatin (LIPITOR) 40 MG tablet; Take 1 tablet (40 mg total) by mouth daily.  Dispense: 90 tablet; Refill: 1 - Lipid Panel With LDL/HDL Ratio  4. Taking medication for chronic disease Chronic.  Controlled.  Stable.  Patient is currently on statin agent and we will check hepatic function to assure that there is no hepatotoxicity from medication. - Hepatic function panel  5. Atherosclerosis of abdominal aorta ([place]) Patient has atherosclerosis of the abdominal aorta.  This is controlled by controlling diet, weight (there is been 3 pound weight loss), lipids, and blood pressure.

## 2020-12-18 LAB — HEPATIC FUNCTION PANEL
ALT: 16 IU/L (ref 0–44)
AST: 22 IU/L (ref 0–40)
Albumin: 3.9 g/dL (ref 3.6–4.6)
Alkaline Phosphatase: 95 IU/L (ref 44–121)
Bilirubin Total: 1.1 mg/dL (ref 0.0–1.2)
Bilirubin, Direct: 0.25 mg/dL (ref 0.00–0.40)
Total Protein: 6.5 g/dL (ref 6.0–8.5)

## 2020-12-18 LAB — LIPID PANEL WITH LDL/HDL RATIO
Cholesterol, Total: 152 mg/dL (ref 100–199)
HDL: 30 mg/dL — ABNORMAL LOW (ref >39)
LDL Chol Calc (NIH): 101 mg/dL — ABNORMAL HIGH (ref 0–99)
LDL/HDL Ratio: 3.4 ratio (ref 0.0–3.6)
Triglycerides: 116 mg/dL (ref 0–149)
VLDL Cholesterol Cal: 21 mg/dL (ref 5–40)

## 2020-12-18 LAB — HEMOGLOBIN A1C
Est. average glucose Bld gHb Est-mCnc: 134 mg/dL
Hgb A1c MFr Bld: 6.3 % — ABNORMAL HIGH (ref 4.8–5.6)

## 2021-01-07 LAB — HM DIABETES EYE EXAM

## 2021-04-19 ENCOUNTER — Encounter: Payer: Self-pay | Admitting: Family Medicine

## 2021-04-19 ENCOUNTER — Ambulatory Visit (INDEPENDENT_AMBULATORY_CARE_PROVIDER_SITE_OTHER): Payer: PPO | Admitting: Family Medicine

## 2021-04-19 ENCOUNTER — Other Ambulatory Visit: Payer: Self-pay

## 2021-04-19 VITALS — BP 110/62 | HR 64 | Ht 73.0 in | Wt 285.0 lb

## 2021-04-19 DIAGNOSIS — E118 Type 2 diabetes mellitus with unspecified complications: Secondary | ICD-10-CM

## 2021-04-19 DIAGNOSIS — Z23 Encounter for immunization: Secondary | ICD-10-CM | POA: Diagnosis not present

## 2021-04-19 MED ORDER — METFORMIN HCL ER 500 MG PO TB24: ORAL_TABLET | ORAL | 1 refills | Status: DC

## 2021-04-19 MED ORDER — PIOGLITAZONE HCL 15 MG PO TABS: 15.0000 mg | ORAL_TABLET | Freq: Every day | ORAL | 1 refills | Status: DC

## 2021-04-19 NOTE — Progress Notes (Signed)
Date:  04/19/2021   Name:  Troy Rodriguez   DOB:  Feb 23, 1941   MRN:  [id number]   Chief Complaint: Diabetes and Flu Vaccine  Diabetes He presents for his follow-up diabetic visit. He has type 2 diabetes mellitus. No MedicAlert identification noted. His disease course has been stable. There are no hypoglycemic associated symptoms. Pertinent negatives for hypoglycemia include no dizziness, headaches or nervousness/anxiousness. There are no diabetic associated symptoms. Pertinent negatives for diabetes include no blurred vision, no chest pain, no foot paresthesias, no foot ulcerations, no polydipsia, no polyuria and no weight loss. There are no hypoglycemic complications. Symptoms are stable. There are no diabetic complications. Risk factors for coronary artery disease include diabetes mellitus, dyslipidemia, hypertension, sedentary lifestyle, male sex and obesity. Current diabetic treatment includes oral agent (monotherapy). His weight is stable. He is following a diabetic diet. When asked about meal planning, he reported none. He has not had a previous visit with a dietitian. He participates in exercise three times a week. His breakfast blood glucose is taken between 6-7 am. His breakfast blood glucose range is generally 110-130 mg/dl. His overall blood glucose range is 110-130 mg/dl. An ACE inhibitor/angiotensin II receptor blocker is being taken. He does not see a podiatrist.Eye exam is current.   Lab Results  Component Value Date   CREATININE 1.47 (H) 09/03/2020   BUN 15 09/03/2020   NA 142 09/03/2020   K 4.7 09/03/2020   CL 103 09/03/2020   CO2 26 09/03/2020   Lab Results  Component Value Date   CHOL 152 12/17/2020   HDL 30 (L) 12/17/2020   LDLCALC 101 (H) 12/17/2020   TRIG 116 12/17/2020   CHOLHDL 4.9 04/01/2018   No results found for: TSH Lab Results  Component Value Date   HGBA1C 6.3 (H) 12/17/2020   Lab Results  Component Value Date   WBC 4.7 02/20/2015   HGB 15.1  02/20/2015   HCT 45.6 02/20/2015   MCV 91 02/20/2015   PLT 176 02/20/2015   Lab Results  Component Value Date   ALT 16 12/17/2020   AST 22 12/17/2020   ALKPHOS 95 12/17/2020   BILITOT 1.1 12/17/2020     Review of Systems  Constitutional:  Negative for chills, fever and weight loss.  HENT:  Negative for drooling, ear discharge, ear pain and sore throat.   Eyes:  Negative for blurred vision.  Respiratory:  Negative for cough, shortness of breath and wheezing.   Cardiovascular:  Negative for chest pain, palpitations and leg swelling.  Gastrointestinal:  Negative for abdominal pain, blood in stool, constipation, diarrhea and nausea.  Endocrine: Negative for polydipsia and polyuria.  Genitourinary:  Negative for dysuria, frequency, hematuria and urgency.  Musculoskeletal:  Negative for back pain, myalgias and neck pain.  Skin:  Negative for rash.  Allergic/Immunologic: Negative for environmental allergies.  Neurological:  Negative for dizziness and headaches.  Hematological:  Does not bruise/bleed easily.  Psychiatric/Behavioral:  Negative for suicidal ideas. The patient is not nervous/anxious.    Patient Active Problem List   Diagnosis Date Noted   Atherosclerosis of abdominal aorta ([place]) 12/17/2020   Mixed hyperlipidemia 01/01/2017   HDL lipoprotein deficiency 01/01/2017   H/O hypercholesterolemia 12/20/2014   Flu vaccine need 12/20/2014   Need for vaccination 27/51/7001   Umbilical hernia without obstruction and without gangrene 12/20/2014   Familial multiple lipoprotein-type hyperlipidemia 12/08/2014   Alimentary obesity 12/08/2014   Type 2 diabetes mellitus with complication, without long-term current use of insulin (  [place]) 12/08/2014   Essential (primary) hypertension 12/08/2014   Blood in the urine 12/08/2014   Routine general medical examination at a health care facility 12/08/2014   Screening for depression 12/08/2014    No Known Allergies  Past Surgical History:   Procedure Laterality Date   CATARACT EXTRACTION Bilateral 2003   CYSTOSCOPY W/ RETROGRADES Bilateral 02/26/2015   Procedure: CYSTOSCOPY WITH RETROGRADE PYELOGRAM;  Surgeon: Hollice Espy, MD;  Location: [facility] ORS;  Service: Urology;  Laterality: Bilateral;   HERNIA REPAIR  2015    Social History   Tobacco Use   Smoking status: Former    Types: Cigarettes    Quit date: 02/23/1975    Years since quitting: 46.1   Smokeless tobacco: Current    Types: Chew   Tobacco comments:    smoked for only 6 mos out of his whole life  Vaping Use   Vaping Use: Never used  Substance Use Topics   Alcohol use: No    Alcohol/week: 0.0 standard drinks   Drug use: No     Medication list has been reviewed and updated.  Current Meds  Medication Sig   aspirin 81 MG EC tablet Take 1 tablet (81 mg total) by mouth daily. Swallow whole.   atorvastatin (LIPITOR) 40 MG tablet Take 1 tablet (40 mg total) by mouth daily.   lisinopril (ZESTRIL) 5 MG tablet Take 1 tablet (5 mg total) by mouth daily.   metFORMIN (GLUCOPHAGE-XR) 500 MG 24 hr tablet TAKE 1 TABLET BY MOUTH EVERY DAY WITH BREAKFAST   Omega-3 Fatty Acids (FISH OIL PO) Take 1 capsule by mouth daily. Reported on 08/08/2015   pioglitazone (ACTOS) 15 MG tablet Take 1 tablet (15 mg total) by mouth daily.    PHQ 2/9 Scores 04/19/2021 12/17/2020 11/19/2020 04/03/2020  PHQ - 2 Score 0 0 0 0  PHQ- 9 Score 0 0 - 0    GAD 7 : Generalized Anxiety Score 04/19/2021 12/17/2020 04/03/2020 12/08/2019  Nervous, Anxious, on Edge 0 0 0 0  Control/stop worrying 0 0 0 0  Worry too much - different things 0 0 0 0  Trouble relaxing 0 0 0 0  Restless 0 0 0 0  Easily annoyed or irritable 0 0 0 0  Afraid - awful might happen 0 0 0 0  Total GAD 7 Score 0 0 0 0    BP Readings from Last 3 Encounters:  04/19/21 110/62  12/17/20 100/62  11/19/20 (!) 120/56    Physical Exam Vitals and nursing note reviewed.  HENT:     Head: Normocephalic.     Right Ear: Tympanic membrane,  ear canal and external ear normal.     Left Ear: Tympanic membrane, ear canal and external ear normal.     Nose: Nose normal.  Eyes:     General: No scleral icterus.       Right eye: No discharge.        Left eye: No discharge.     Conjunctiva/sclera: Conjunctivae normal.     Pupils: Pupils are equal, round, and reactive to light.  Neck:     Thyroid: No thyromegaly.     Vascular: No JVD.     Trachea: No tracheal deviation.  Cardiovascular:     Rate and Rhythm: Normal rate and regular rhythm.     Heart sounds: Normal heart sounds. No murmur heard.   No friction rub. No gallop.  Pulmonary:     Effort: No respiratory distress.     Breath  sounds: Normal breath sounds. No wheezing, rhonchi or rales.  Abdominal:     General: Bowel sounds are normal.     Palpations: Abdomen is soft. There is no mass.     Tenderness: There is no abdominal tenderness. There is no guarding or rebound.  Musculoskeletal:        General: No tenderness. Normal range of motion.     Cervical back: Normal range of motion and neck supple.  Lymphadenopathy:     Cervical: No cervical adenopathy.  Skin:    General: Skin is warm.     Findings: No rash.  Neurological:     Mental Status: He is alert and oriented to person, place, and time.     Cranial Nerves: No cranial nerve deficit.     Deep Tendon Reflexes: Reflexes are normal and symmetric.    Wt Readings from Last 3 Encounters:  04/19/21 285 lb (129.3 kg)  12/17/20 278 lb (126.1 kg)  11/19/20 280 lb 12.8 oz (127.4 kg)    BP 110/62   Pulse 64   Ht 6\' 1"  (1.854 m)   Wt 285 lb (129.3 kg)   BMI 37.60 kg/m   Assessment and Plan:  1. Type 2 diabetes mellitus with complication, without long-term current use of insulin (HCC) Chronic.  Controlled.  Stable.  Continue Actos 15 mg and metformin XR 500 mg once a day.  Will check A1c for current level of control and recheck in 4 months - HgB A1c - pioglitazone (ACTOS) 15 MG tablet; Take 1 tablet (15 mg total)  by mouth daily.  Dispense: 90 tablet; Refill: 1 - metFORMIN (GLUCOPHAGE-XR) 500 MG 24 hr tablet; TAKE 1 TABLET BY MOUTH EVERY DAY WITH BREAKFAST  Dispense: 90 tablet; Refill: 1  2. Need for immunization against influenza Discussed and administered - Flu Vaccine QUAD High Dose(Fluad)

## 2021-04-20 LAB — HEMOGLOBIN A1C
Est. average glucose Bld gHb Est-mCnc: 137 mg/dL
Hgb A1c MFr Bld: 6.4 % — ABNORMAL HIGH (ref 4.8–5.6)

## 2021-08-11 ENCOUNTER — Other Ambulatory Visit: Payer: Self-pay | Admitting: Family Medicine

## 2021-08-11 DIAGNOSIS — I10 Essential (primary) hypertension: Secondary | ICD-10-CM

## 2021-08-11 NOTE — Telephone Encounter (Signed)
Requested medication (s) are due for refill today: yes  Requested medication (s) are on the active medication list: yes  Last refill:  12/17/20 #90 1 RF  Future visit scheduled: yes  Notes to clinic:  overdue lab work   Requested Prescriptions  Pending Prescriptions Disp Refills   lisinopril (ZESTRIL) 5 MG tablet [Pharmacy Med Name: LISINOPRIL 5 MG TABLET] 90 tablet 1    Sig: TAKE 1 TABLET (5 MG TOTAL) BY MOUTH DAILY.     Cardiovascular:  ACE Inhibitors Failed - 08/11/2021 12:42 AM      Failed - Cr in normal range and within 180 days    Creatinine  Date Value Ref Range Status  10/27/2012 1.18 0.60 - 1.30 mg/dL Final   Creatinine, Ser  Date Value Ref Range Status  09/03/2020 1.47 (H) 0.76 - 1.27 mg/dL Final          Failed - K in normal range and within 180 days    Potassium  Date Value Ref Range Status  09/03/2020 4.7 3.5 - 5.2 mmol/L Final  10/27/2012 3.9 3.5 - 5.1 mmol/L Final          Passed - Patient is not pregnant      Passed - Last BP in normal range    BP Readings from Last 1 Encounters:  04/19/21 110/62          Passed - Valid encounter within last 6 months    Recent Outpatient Visits           3 months ago Type 2 diabetes mellitus with complication, without long-term current use of insulin ([place])   [facility] Juline Patch, MD   7 months ago Type 2 diabetes mellitus with complication, without long-term current use of insulin ([place])   [facility] Juline Patch, MD   11 months ago Type 2 diabetes mellitus with complication, without long-term current use of insulin ([place])   [facility] Juline Patch, MD   1 year ago Type 2 diabetes mellitus with complication, without long-term current use of insulin ([place])   [facility] Juline Patch, MD   1 year ago Type 2 diabetes mellitus with complication, without long-term current use of insulin ([place])   [facility] Juline Patch, MD       Future  Appointments             In 1 week Juline Patch, MD [facility], [facility]

## 2021-08-20 ENCOUNTER — Other Ambulatory Visit: Payer: Self-pay

## 2021-08-20 ENCOUNTER — Ambulatory Visit (INDEPENDENT_AMBULATORY_CARE_PROVIDER_SITE_OTHER): Payer: PPO | Admitting: Family Medicine

## 2021-08-20 ENCOUNTER — Encounter: Payer: Self-pay | Admitting: Family Medicine

## 2021-08-20 VITALS — BP 132/70 | HR 80 | Ht 73.0 in | Wt 284.0 lb

## 2021-08-20 DIAGNOSIS — E118 Type 2 diabetes mellitus with unspecified complications: Secondary | ICD-10-CM

## 2021-08-20 DIAGNOSIS — E7849 Other hyperlipidemia: Secondary | ICD-10-CM | POA: Diagnosis not present

## 2021-08-20 DIAGNOSIS — I10 Essential (primary) hypertension: Secondary | ICD-10-CM | POA: Diagnosis not present

## 2021-08-20 MED ORDER — PIOGLITAZONE HCL 15 MG PO TABS: 15.0000 mg | ORAL_TABLET | Freq: Every day | ORAL | 1 refills | Status: DC

## 2021-08-20 MED ORDER — LISINOPRIL 5 MG PO TABS: 5.0000 mg | ORAL_TABLET | Freq: Every day | ORAL | 1 refills | Status: DC

## 2021-08-20 MED ORDER — METFORMIN HCL ER 500 MG PO TB24: ORAL_TABLET | ORAL | 1 refills | Status: DC

## 2021-08-20 MED ORDER — ATORVASTATIN CALCIUM 40 MG PO TABS: 40.0000 mg | ORAL_TABLET | Freq: Every day | ORAL | 1 refills | Status: DC

## 2021-08-20 NOTE — Progress Notes (Signed)
Date:  08/20/2021   Name:  Troy Rodriguez   DOB:  1941-01-10   MRN:  [id number]   Chief Complaint: Diabetes, Hypertension, and Hyperlipidemia  Diabetes He presents for his follow-up diabetic visit. He has type 2 diabetes mellitus. His disease course has been stable. There are no hypoglycemic associated symptoms. Pertinent negatives for hypoglycemia include no dizziness, headaches, nervousness/anxiousness or sweats. There are no diabetic associated symptoms. Pertinent negatives for diabetes include no blurred vision, no chest pain and no polydipsia. There are no hypoglycemic complications. Symptoms are stable. Pertinent negatives for diabetic complications include no CVA, PVD or retinopathy. (atherosclerosis) Risk factors for coronary artery disease include male sex and obesity. Current diabetic treatment includes oral agent (dual therapy). He is compliant with treatment all of the time. His weight is stable. He is following a generally healthy diet. Meal planning includes avoidance of concentrated sweets and carbohydrate counting. He participates in exercise intermittently. There is no change in his home blood glucose trend.  Hypertension This is a chronic problem. The current episode started more than 1 year ago. The problem has been gradually improving since onset. The problem is controlled. Pertinent negatives include no anxiety, blurred vision, chest pain, headaches, malaise/fatigue, neck pain, orthopnea, palpitations, peripheral edema, PND, shortness of breath or sweats. Past treatments include ACE inhibitors. The current treatment provides moderate improvement. There are no compliance problems.  There is no history of angina, kidney disease, CAD/MI, CVA, heart failure, left ventricular hypertrophy, PVD or retinopathy. There is no history of chronic renal disease, a hypertension causing med or renovascular disease.  Hyperlipidemia This is a chronic problem. The current episode started more than 1  year ago. The problem is controlled. Recent lipid tests were reviewed and are normal. He has no history of chronic renal disease, diabetes, hypothyroidism, liver disease, obesity or nephrotic syndrome. Pertinent negatives include no chest pain, myalgias or shortness of breath. Current antihyperlipidemic treatment includes statins. The current treatment provides moderate improvement of lipids. There are no compliance problems.    Lab Results  Component Value Date   NA 142 09/03/2020   K 4.7 09/03/2020   CO2 26 09/03/2020   GLUCOSE 139 (H) 09/03/2020   BUN 15 09/03/2020   CREATININE 1.47 (H) 09/03/2020   CALCIUM 9.5 09/03/2020   GFRNONAA 44 (L) 09/03/2020   Lab Results  Component Value Date   CHOL 152 12/17/2020   HDL 30 (L) 12/17/2020   LDLCALC 101 (H) 12/17/2020   TRIG 116 12/17/2020   CHOLHDL 4.9 04/01/2018   No results found for: TSH Lab Results  Component Value Date   HGBA1C 6.4 (H) 04/19/2021   Lab Results  Component Value Date   WBC 4.7 02/20/2015   HGB 15.1 02/20/2015   HCT 45.6 02/20/2015   MCV 91 02/20/2015   PLT 176 02/20/2015   Lab Results  Component Value Date   ALT 16 12/17/2020   AST 22 12/17/2020   ALKPHOS 95 12/17/2020   BILITOT 1.1 12/17/2020   No results found for: 25OHVITD2, 25OHVITD3, VD25OH   Review of Systems  Constitutional:  Negative for chills, fever and malaise/fatigue.  HENT:  Negative for drooling, ear discharge, ear pain and sore throat.   Eyes:  Negative for blurred vision.  Respiratory:  Negative for cough, chest tightness, shortness of breath and wheezing.   Cardiovascular:  Negative for chest pain, palpitations, orthopnea, leg swelling and PND.  Gastrointestinal:  Negative for abdominal pain, blood in stool, constipation, diarrhea and nausea.  Endocrine: Negative for polydipsia.  Genitourinary:  Negative for dysuria, frequency, hematuria and urgency.  Musculoskeletal:  Negative for back pain, myalgias and neck pain.  Skin:  Negative  for rash.  Allergic/Immunologic: Negative for environmental allergies.  Neurological:  Negative for dizziness and headaches.  Hematological:  Negative for adenopathy. Does not bruise/bleed easily.  Psychiatric/Behavioral:  Negative for suicidal ideas. The patient is not nervous/anxious.    Patient Active Problem List   Diagnosis Date Noted   Atherosclerosis of abdominal aorta ([place]) 12/17/2020   Mixed hyperlipidemia 01/01/2017   HDL lipoprotein deficiency 01/01/2017   H/O hypercholesterolemia 12/20/2014   Flu vaccine need 12/20/2014   Need for vaccination 22/97/9892   Umbilical hernia without obstruction and without gangrene 12/20/2014   Familial multiple lipoprotein-type hyperlipidemia 12/08/2014   Alimentary obesity 12/08/2014   Type 2 diabetes mellitus with complication, without long-term current use of insulin ([place]) 12/08/2014   Essential (primary) hypertension 12/08/2014   Blood in the urine 12/08/2014   Routine general medical examination at a health care facility 12/08/2014   Screening for depression 12/08/2014    No Known Allergies  Past Surgical History:  Procedure Laterality Date   CATARACT EXTRACTION Bilateral 2003   CYSTOSCOPY W/ RETROGRADES Bilateral 02/26/2015   Procedure: CYSTOSCOPY WITH RETROGRADE PYELOGRAM;  Surgeon: Hollice Espy, MD;  Location: [facility];  Service: Urology;  Laterality: Bilateral;   HERNIA REPAIR  2015    Social History   Tobacco Use   Smoking status: Former    Types: Cigarettes    Quit date: 02/23/1975    Years since quitting: 46.5   Smokeless tobacco: Former    Types: Chew    Quit date: 08/28/2018   Tobacco comments:    smoked for only 6 mos out of his whole life  Vaping Use   Vaping Use: Never used  Substance Use Topics   Alcohol use: No    Alcohol/week: 0.0 standard drinks   Drug use: No     Medication list has been reviewed and updated.  Current Meds  Medication Sig   aspirin 81 MG EC tablet Take 1 tablet (81 mg total) by  mouth daily. Swallow whole.   atorvastatin (LIPITOR) 40 MG tablet Take 1 tablet (40 mg total) by mouth daily.   lisinopril (ZESTRIL) 5 MG tablet TAKE 1 TABLET (5 MG TOTAL) BY MOUTH DAILY.   metFORMIN (GLUCOPHAGE-XR) 500 MG 24 hr tablet TAKE 1 TABLET BY MOUTH EVERY DAY WITH BREAKFAST   Omega-3 Fatty Acids (FISH OIL PO) Take 1 capsule by mouth daily. Reported on 08/08/2015   pioglitazone (ACTOS) 15 MG tablet Take 1 tablet (15 mg total) by mouth daily.    PHQ 2/9 Scores 08/20/2021 04/19/2021 12/17/2020 11/19/2020  PHQ - 2 Score 0 0 0 0  PHQ- 9 Score 0 0 0 -    GAD 7 : Generalized Anxiety Score 08/20/2021 04/19/2021 12/17/2020 04/03/2020  Nervous, Anxious, on Edge 0 0 0 0  Control/stop worrying 0 0 0 0  Worry too much - different things 0 0 0 0  Trouble relaxing 0 0 0 0  Restless 0 0 0 0  Easily annoyed or irritable 0 0 0 0  Afraid - awful might happen 0 0 0 0  Total GAD 7 Score 0 0 0 0  Anxiety Difficulty Not difficult at all - - -    BP Readings from Last 3 Encounters:  08/20/21 132/70  04/19/21 110/62  12/17/20 100/62    Physical Exam Vitals and nursing note reviewed.  HENT:     Head: Normocephalic.     Right Ear: Tympanic membrane, ear canal and external ear normal. There is no impacted cerumen.     Left Ear: Tympanic membrane, ear canal and external ear normal. There is no impacted cerumen.     Nose: Nose normal. No congestion or rhinorrhea.  Eyes:     General: No scleral icterus.       Right eye: No discharge.        Left eye: No discharge.     Conjunctiva/sclera: Conjunctivae normal.     Pupils: Pupils are equal, round, and reactive to light.  Neck:     Thyroid: No thyromegaly.     Vascular: No JVD.     Trachea: No tracheal deviation.  Cardiovascular:     Rate and Rhythm: Normal rate and regular rhythm.     Heart sounds: Normal heart sounds. No murmur heard.   No friction rub. No gallop.  Pulmonary:     Effort: No respiratory distress.     Breath sounds: Normal breath  sounds. No wheezing, rhonchi or rales.  Abdominal:     General: Bowel sounds are normal.     Palpations: Abdomen is soft. There is no mass.     Tenderness: There is no abdominal tenderness. There is no guarding or rebound.  Musculoskeletal:        General: No tenderness. Normal range of motion.     Cervical back: Normal range of motion and neck supple.  Lymphadenopathy:     Cervical: No cervical adenopathy.  Skin:    General: Skin is warm.     Findings: No rash.  Neurological:     Mental Status: He is alert and oriented to person, place, and time.     Cranial Nerves: No cranial nerve deficit.     Deep Tendon Reflexes: Reflexes are normal and symmetric.    Wt Readings from Last 3 Encounters:  08/20/21 284 lb (128.8 kg)  04/19/21 285 lb (129.3 kg)  12/17/20 278 lb (126.1 kg)    BP 132/70    Pulse 80    Ht 6\' 1"  (1.854 m)    Wt 284 lb (128.8 kg)    BMI 37.47 kg/m   Assessment and Plan:  1. Familial multiple lipoprotein-type hyperlipidemia Chronic.  Controlled.  Stable.  Review of lipid panels have been controlled.  Continue atorvastatin 40 mg once a day. - atorvastatin (LIPITOR) 40 MG tablet; Take 1 tablet (40 mg total) by mouth daily.  Dispense: 90 tablet; Refill: 1  2. Essential (primary) hypertension Chronic.  Controlled.  Stable.  Blood pressure 132/70.  Continue lisinopril 5 mg once a day.  Will check renal function panel. - lisinopril (ZESTRIL) 5 MG tablet; Take 1 tablet (5 mg total) by mouth daily.  Dispense: 90 tablet; Refill: 1 - Renal Function Panel  3. Type 2 diabetes mellitus with complication, without long-term current use of insulin ([facility]) Chronic.  Controlled.  Stable.  Continue metformin XR 500 mg once a day and Actos 15 mg 1 a day.  We will check microalbuminuria and A1c and renal function panel for current status of laboratory control of diabetes. - metFORMIN (GLUCOPHAGE-XR) 500 MG 24 hr tablet; TAKE 1 TABLET BY MOUTH EVERY DAY WITH BREAKFAST  Dispense: 90  tablet; Refill: 1 - pioglitazone (ACTOS) 15 MG tablet; Take 1 tablet (15 mg total) by mouth daily.  Dispense: 90 tablet; Refill: 1 - Microalbumin, urine - HgB A1c - Renal Function Panel

## 2021-08-21 LAB — RENAL FUNCTION PANEL
Albumin: 4 g/dL (ref 3.6–4.6)
BUN/Creatinine Ratio: 13 (ref 10–24)
BUN: 18 mg/dL (ref 8–27)
CO2: 26 mmol/L (ref 20–29)
Calcium: 9.4 mg/dL (ref 8.6–10.2)
Chloride: 105 mmol/L (ref 96–106)
Creatinine, Ser: 1.41 mg/dL — ABNORMAL HIGH (ref 0.76–1.27)
Glucose: 129 mg/dL — ABNORMAL HIGH (ref 70–99)
Phosphorus: 2.6 mg/dL — ABNORMAL LOW (ref 2.8–4.1)
Potassium: 4.6 mmol/L (ref 3.5–5.2)
Sodium: 142 mmol/L (ref 134–144)
eGFR: 50 mL/min/{1.73_m2} — ABNORMAL LOW (ref >59)

## 2021-08-21 LAB — HEMOGLOBIN A1C
Est. average glucose Bld gHb Est-mCnc: 140 mg/dL
Hgb A1c MFr Bld: 6.5 % — ABNORMAL HIGH (ref 4.8–5.6)

## 2021-08-21 LAB — MICROALBUMIN, URINE: Microalbumin, Urine: 22.5 ug/mL

## 2021-11-20 ENCOUNTER — Ambulatory Visit (INDEPENDENT_AMBULATORY_CARE_PROVIDER_SITE_OTHER): Payer: PPO

## 2021-11-20 VITALS — BP 132/62 | HR 54 | Resp 15 | Ht 73.0 in | Wt 281.8 lb

## 2021-11-20 DIAGNOSIS — Z0000 Encounter for general adult medical examination without abnormal findings: Secondary | ICD-10-CM

## 2021-11-20 NOTE — Patient Instructions (Signed)
Mr. Meaders , ?Thank you for taking time to come for your Medicare Wellness Visit. I appreciate your ongoing commitment to your health goals. Please review the following plan we discussed and let me know if I can assist you in the future.  ? ?Screening recommendations/referrals: ?Colonoscopy: no longer required ?Recommended yearly ophthalmology/optometry visit for glaucoma screening and checkup ?Recommended yearly dental visit for hygiene and checkup ? ?Vaccinations: ?Influenza vaccine: done 04/19/21 ?Pneumococcal vaccine: done 12/29/16 ?Tdap vaccine: done 08/27/15 ?Shingles vaccine: done 03/11/20 & 05/09/20   ?Covid-19: done 08/16/19 & 09/10/19 ? ?Conditions/risks identified: Keep up the great work! ? ?Next appointment: Follow up in one year for your annual wellness visit.  ? ?Preventive Care 83 Years and Older, Male ?Preventive care refers to lifestyle choices and visits with your health care provider that can promote health and wellness. ?What does preventive care include? ?A yearly physical exam. This is also called an annual well check. ?Dental exams once or twice a year. ?Routine eye exams. Ask your health care provider how often you should have your eyes checked. ?Personal lifestyle choices, including: ?Daily care of your teeth and gums. ?Regular physical activity. ?Eating a healthy diet. ?Avoiding tobacco and drug use. ?Limiting alcohol use. ?Practicing safe sex. ?Taking low doses of aspirin every day. ?Taking vitamin and mineral supplements as recommended by your health care provider. ?What happens during an annual well check? ?The services and screenings done by your health care provider during your annual well check will depend on your age, overall health, lifestyle risk factors, and family history of disease. ?Counseling  ?Your health care provider may ask you questions about your: ?Alcohol use. ?Tobacco use. ?Drug use. ?Emotional well-being. ?Home and relationship well-being. ?Sexual activity. ?Eating  habits. ?History of falls. ?Memory and ability to understand (cognition). ?Work and work Statistician. ?Screening  ?You may have the following tests or measurements: ?Height, weight, and BMI. ?Blood pressure. ?Lipid and cholesterol levels. These may be checked every 5 years, or more frequently if you are over 75 years old. ?Skin check. ?Lung cancer screening. You may have this screening every year starting at age 60 if you have a 30-pack-year history of smoking and currently smoke or have quit within the past 15 years. ?Fecal occult blood test (FOBT) of the stool. You may have this test every year starting at age 83. ?Flexible sigmoidoscopy or colonoscopy. You may have a sigmoidoscopy every 5 years or a colonoscopy every 10 years starting at age 83. ?Prostate cancer screening. Recommendations will vary depending on your family history and other risks. ?Hepatitis C blood test. ?Hepatitis B blood test. ?Sexually transmitted disease (STD) testing. ?Diabetes screening. This is done by checking your blood sugar (glucose) after you have not eaten for a while (fasting). You may have this done every 1-3 years. ?Abdominal aortic aneurysm (AAA) screening. You may need this if you are a current or former smoker. ?Osteoporosis. You may be screened starting at age 36 if you are at high risk. ?Talk with your health care provider about your test results, treatment options, and if necessary, the need for more tests. ?Vaccines  ?Your health care provider may recommend certain vaccines, such as: ?Influenza vaccine. This is recommended every year. ?Tetanus, diphtheria, and acellular pertussis (Tdap, Td) vaccine. You may need a Td booster every 10 years. ?Zoster vaccine. You may need this after age 83. ?Pneumococcal 13-valent conjugate (PCV13) vaccine. One dose is recommended after age 83. ?Pneumococcal polysaccharide (PPSV23) vaccine. One dose is recommended after age 50. ?Talk  to your health care provider about which screenings and  vaccines you need and how often you need them. ?This information is not intended to replace advice given to you by your health care provider. Make sure you discuss any questions you have with your health care provider. ?Document Released: 08/10/2015 Document Revised: 04/02/2016 Document Reviewed: 05/15/2015 ?Elsevier Interactive Patient Education ? 2017 [place]. ? ?Fall Prevention in the Home ?Falls can cause injuries. They can happen to people of all ages. There are many things you can do to make your home safe and to help prevent falls. ?What can I do on the outside of my home? ?Regularly fix the edges of walkways and driveways and fix any cracks. ?Remove anything that might make you trip as you walk through a door, such as a raised step or threshold. ?Trim any bushes or trees on the path to your home. ?Use bright outdoor lighting. ?Clear any walking paths of anything that might make someone trip, such as rocks or tools. ?Regularly check to see if handrails are loose or broken. Make sure that both sides of any steps have handrails. ?Any raised decks and porches should have guardrails on the edges. ?Have any leaves, snow, or ice cleared regularly. ?Use sand or salt on walking paths during winter. ?Clean up any spills in your garage right away. This includes oil or grease spills. ?What can I do in the bathroom? ?Use night lights. ?Install grab bars by the toilet and in the tub and shower. Do not use towel bars as grab bars. ?Use non-skid mats or decals in the tub or shower. ?If you need to sit down in the shower, use a plastic, non-slip stool. ?Keep the floor dry. Clean up any water that spills on the floor as soon as it happens. ?Remove soap buildup in the tub or shower regularly. ?Attach bath mats securely with double-sided non-slip rug tape. ?Do not have throw rugs and other things on the floor that can make you trip. ?What can I do in the bedroom? ?Use night lights. ?Make sure that you have a light by your  bed that is easy to reach. ?Do not use any sheets or blankets that are too big for your bed. They should not hang down onto the floor. ?Have a firm chair that has side arms. You can use this for support while you get dressed. ?Do not have throw rugs and other things on the floor that can make you trip. ?What can I do in the kitchen? ?Clean up any spills right away. ?Avoid walking on wet floors. ?Keep items that you use a lot in easy-to-reach places. ?If you need to reach something above you, use a strong step stool that has a grab bar. ?Keep electrical cords out of the way. ?Do not use floor polish or wax that makes floors slippery. If you must use wax, use non-skid floor wax. ?Do not have throw rugs and other things on the floor that can make you trip. ?What can I do with my stairs? ?Do not leave any items on the stairs. ?Make sure that there are handrails on both sides of the stairs and use them. Fix handrails that are broken or loose. Make sure that handrails are as long as the stairways. ?Check any carpeting to make sure that it is firmly attached to the stairs. Fix any carpet that is loose or worn. ?Avoid having throw rugs at the top or bottom of the stairs. If you do have throw rugs,  attach them to the floor with carpet tape. ?Make sure that you have a light switch at the top of the stairs and the bottom of the stairs. If you do not have them, ask someone to add them for you. ?What else can I do to help prevent falls? ?Wear shoes that: ?Do not have high heels. ?Have rubber bottoms. ?Are comfortable and fit you well. ?Are closed at the toe. Do not wear sandals. ?If you use a stepladder: ?Make sure that it is fully opened. Do not climb a closed stepladder. ?Make sure that both sides of the stepladder are locked into place. ?Ask someone to hold it for you, if possible. ?Clearly mark and make sure that you can see: ?Any grab bars or handrails. ?First and last steps. ?Where the edge of each step is. ?Use tools that  help you move around (mobility aids) if they are needed. These include: ?Canes. ?Walkers. ?Scooters. ?Crutches. ?Turn on the lights when you go into a dark area. Replace any light bulbs as soon as they burn out.

## 2021-11-20 NOTE — Progress Notes (Signed)
? ?Subjective:  ? Troy Rodriguez is a 81 y.o. male who presents for Medicare Annual/Subsequent preventive examination. ? ?Review of Systems    ? ?Cardiac Risk Factors include: advanced age (>79mn, >>27women);diabetes mellitus;dyslipidemia;male gender;hypertension;obesity (BMI >30kg/m2) ? ?   ?Objective:  ?  ?Today's Vitals  ? 11/20/21 1123  ?BP: 132/62  ?Pulse: (!) 54  ?Resp: 15  ?SpO2: 94%  ?Weight: 281 lb 12.8 oz (127.8 kg)  ?Height: '6\' 1"'$  (1.854 m)  ? ?Body mass index is 37.18 kg/m?. ? ? ?  11/20/2021  ? 11:28 AM 11/19/2020  ? 11:33 AM 01/01/2017  ?  8:22 AM 12/29/2016  ?  1:48 PM 02/26/2015  ?  6:58 AM  ?Advanced Directives  ?Does Patient Have a Medical Advance Directive? Yes Yes Yes No;Yes No  ?Type of AParamed[place]of [place] will [place] will  [place] will   ?Copy of [place]? Yes - validated most recent copy scanned in chart (See row information) Yes - validated most recent copy scanned in chart (See row information) Yes No - copy requested   ?Would patient like information on creating a medical advance directive?    Yes (MAU/Ambulatory/Procedural Areas - Information given)   ? ? ?Current Medications (verified) ?Outpatient Encounter Medications as of 11/20/2021  ?Medication Sig  ? aspirin 81 MG EC tablet Take 1 tablet (81 mg total) by mouth daily. Swallow whole.  ? atorvastatin (LIPITOR) 40 MG tablet Take 1 tablet (40 mg total) by mouth daily.  ? lisinopril (ZESTRIL) 5 MG tablet Take 1 tablet (5 mg total) by mouth daily.  ? metFORMIN (GLUCOPHAGE-XR) 500 MG 24 hr tablet TAKE 1 TABLET BY MOUTH EVERY DAY WITH BREAKFAST  ? Omega-3 Fatty Acids (FISH OIL PO) Take 1 capsule by mouth daily. Reported on 08/08/2015  ? pioglitazone (ACTOS) 15 MG tablet Take 1 tablet (15 mg total) by mouth daily.  ? ?No facility-administered encounter medications on file as of 11/20/2021.  ? ? ?Allergies (verified) ?Patient has no  known allergies.  ? ?History: ?Past Medical History:  ?Diagnosis Date  ? Arthritis   ? Diabetes ([place]   ? Hematuria   ? HLD (hyperlipidemia)   ? Umbilical hernia   ? ?Past Surgical History:  ?Procedure Laterality Date  ? CATARACT EXTRACTION Bilateral 2003  ? CYSTOSCOPY W/ RETROGRADES Bilateral 02/26/2015  ? Procedure: CYSTOSCOPY WITH RETROGRADE PYELOGRAM;  Surgeon: AHollice Espy MD;  Location: [facility] ORS;  Service: Urology;  Laterality: Bilateral;  ? HERNIA REPAIR  2015  ? ?Family History  ?Problem Relation Age of Onset  ? Prostate cancer Brother   ? Kidney disease Neg Hx   ? ?Social History  ? ?Socioeconomic History  ? Marital status: Married  ?  Spouse name: Not on file  ? Number of children: Not on file  ? Years of education: Not on file  ? Highest education level: Not on file  ?Occupational History  ? Not on file  ?Tobacco Use  ? Smoking status: Former  ?  Types: Cigarettes  ?  Quit date: 02/23/1975  ?  Years since quitting: 46.7  ? Smokeless tobacco: Former  ?  Types: Chew  ?  Quit date: 08/28/2018  ? Tobacco comments:  ?  smoked for only 6 mos out of his whole life  ?Vaping Use  ? Vaping Use: Never used  ?Substance and Sexual Activity  ? Alcohol use: No  ?  Alcohol/week: 0.0 standard drinks  ?  Drug use: No  ? Sexual activity: Not Currently  ?Other Topics Concern  ? Not on file  ?Social History Narrative  ? Caffeine use:2 per day.  ? ?Social Determinants of Health  ? ?Financial Resource Strain: Low Risk   ? Difficulty of Paying Living Expenses: Not hard at all  ?Food Insecurity: No Food Insecurity  ? Worried About Charity fundraiser in the Last Year: Never true  ? Ran Out of Food in the Last Year: Never true  ?Transportation Needs: No Transportation Needs  ? Lack of Transportation (Medical): No  ? Lack of Transportation (Non-Medical): No  ?Physical Activity: Sufficiently Active  ? Days of Exercise per Week: 7 days  ? Minutes of Exercise per Session: 30 min  ?Stress: No Stress Concern Present  ? Feeling of Stress :  Not at all  ?Social Connections: Moderately Isolated  ? Frequency of Communication with Friends and Family: More than three times a week  ? Frequency of Social Gatherings with Friends and Family: Three times a week  ? Attends Religious Services: Never  ? Active Member of Clubs or Organizations: No  ? Attends Archivist Meetings: Never  ? Marital Status: Married  ? ? ?Tobacco Counseling ?Counseling given: Not Answered ?Tobacco comments: smoked for only 6 mos out of his whole life ? ? ?Clinical Intake: ? ?Pre-visit preparation completed: Yes ? ?Pain : No/denies pain ? ?  ? ?BMI - recorded: 37.18 ?Nutritional Status: BMI > 30  Obese ?Nutritional Risks: None ?Diabetes: Yes ?CBG done?: No ?Did pt. bring in CBG monitor from home?: No ? ?How often do you need to have someone help you when you read instructions, pamphlets, or other written materials from your doctor or pharmacy?: 1 - Never ? ?Nutrition Risk Assessment: ? ?Has the patient had any N/V/D within the last 2 months?  No  ?Does the patient have any non-healing wounds?  No  ?Has the patient had any unintentional weight loss or weight gain?  No  ? ?Diabetes: ? ?Is the patient diabetic?  Yes  ?If diabetic, was a CBG obtained today?  No  ?Did the patient bring in their glucometer from home?  No  ?How often do you monitor your CBG's? Occasionally per patient.  ? ?Financial Strains and Diabetes Management: ? ?Are you having any financial strains with the device, your supplies or your medication? No .  ?Does the patient want to be seen by Chronic Care Management for management of their diabetes?  No  ?Would the patient like to be referred to a Nutritionist or for Diabetic Management?  No  ? ?Diabetic Exams: ? ?Diabetic Eye Exam: Completed 01/12/21.  ? ?Diabetic Foot Exam: Completed 12/17/20.  ?Interpreter Needed?: No ? ?Information entered by :: Clemetine Marker LPN ? ? ?Activities of Daily Living ? ?  11/20/2021  ? 11:29 AM  ?In your present state of health, do you  have any difficulty performing the following activities:  ?Hearing? 1  ?Vision? 0  ?Difficulty concentrating or making decisions? 0  ?Walking or climbing stairs? 0  ?Dressing or bathing? 0  ?Doing errands, shopping? 0  ?Preparing Food and eating ? N  ?Using the Toilet? N  ?In the past six months, have you accidently leaked urine? N  ?Do you have problems with loss of bowel control? N  ?Managing your Medications? N  ?Managing your Finances? N  ?Housekeeping or managing your Housekeeping? N  ? ? ?Patient Care Team: ?Juline Patch, MD as PCP -  General (Family Medicine) ? ?Indicate any recent Medical Services you may have received from other than [facility] providers in the past year (date may be approximate). ? ?   ?Assessment:  ? This is a routine wellness examination for Pellegrino. ? ?Hearing/Vision screen ?Hearing Screening - Comments:: Pt states hearing difficulty, has a hearing aid but background noise is bothersome and he does not wear it ?Vision Screening - Comments:: Annual vision screenings done at [facility]  ? ?Dietary issues and exercise activities discussed: ?Current Exercise Habits: Home exercise routine, Type of exercise: walking, Time (Minutes): 30, Frequency (Times/Week): 7, Weekly Exercise (Minutes/Week): 210, Intensity: Mild, Exercise limited by: None identified ? ? Goals Addressed   ? ?  ?  ?  ?  ? This Visit's Progress  ?  Increase water intake   On track  ?  Recommend drinking 4-5 glasses of water a day ? ?  ? ?  ? ?Depression Screen ? ?  11/20/2021  ? 11:27 AM 08/20/2021  ?  9:24 AM 04/19/2021  ?  9:30 AM 12/17/2020  ?  9:04 AM 11/19/2020  ? 11:32 AM 04/03/2020  ?  9:32 AM 12/08/2019  ?  9:36 AM  ?PHQ 2/9 Scores  ?PHQ - 2 Score 0 0 0 0 0 0 0  ?PHQ- 9 Score 0 0 0 0  0 0  ?  ?Fall Risk ? ?  11/20/2021  ? 11:29 AM 11/19/2020  ? 11:35 AM 04/03/2020  ?  9:32 AM 12/08/2019  ?  9:36 AM 11/16/2019  ? 10:55 AM  ?Fall Risk   ?Falls in the past year? 0 0 0 0 0  ?Number falls in past yr: 0 0   0  ?Injury with Fall? 0 0   0   ?Risk for fall due to : No Fall Risks No Fall Risks   No Fall Risks  ?Follow up Falls prevention discussed Falls prevention discussed Falls evaluation completed Falls evaluation completed Falls prevention d

## 2021-12-18 ENCOUNTER — Encounter: Payer: Self-pay | Admitting: Family Medicine

## 2021-12-18 ENCOUNTER — Ambulatory Visit (INDEPENDENT_AMBULATORY_CARE_PROVIDER_SITE_OTHER): Payer: PPO | Admitting: Family Medicine

## 2021-12-18 VITALS — BP 130/80 | HR 80 | Ht 73.0 in | Wt 276.0 lb

## 2021-12-18 DIAGNOSIS — E118 Type 2 diabetes mellitus with unspecified complications: Secondary | ICD-10-CM | POA: Diagnosis not present

## 2021-12-18 DIAGNOSIS — I700 Atherosclerosis of aorta: Secondary | ICD-10-CM

## 2021-12-18 DIAGNOSIS — E7849 Other hyperlipidemia: Secondary | ICD-10-CM

## 2021-12-18 MED ORDER — PIOGLITAZONE HCL 15 MG PO TABS: 15.0000 mg | ORAL_TABLET | Freq: Every day | ORAL | 1 refills | Status: DC

## 2021-12-18 MED ORDER — METFORMIN HCL ER 500 MG PO TB24: ORAL_TABLET | ORAL | 1 refills | Status: DC

## 2021-12-18 NOTE — Progress Notes (Signed)
Date:  12/18/2021   Name:  Troy Rodriguez   DOB:  05/22/1941   MRN:  [id number]   Chief Complaint: Diabetes  Diabetes He presents for his follow-up diabetic visit. He has type 2 diabetes mellitus. His disease course has been stable. There are no hypoglycemic associated symptoms. There are no diabetic associated symptoms. Pertinent negatives for diabetes include no blurred vision, no chest pain, no fatigue, no foot paresthesias, no foot ulcerations, no polydipsia, no polyphagia, no polyuria, no visual change, no weakness and no weight loss. There are no hypoglycemic complications. Symptoms are stable. There are no diabetic complications. Risk factors for coronary artery disease include diabetes mellitus, dyslipidemia and hypertension. Current diabetic treatment includes oral agent (dual therapy). He is compliant with treatment most of the time. His weight is stable. He is following a generally healthy diet. Meal planning includes avoidance of concentrated sweets and carbohydrate counting. An ACE inhibitor/angiotensin II receptor blocker is being taken.  Hyperlipidemia This is a chronic problem. The current episode started more than 1 year ago. The problem is controlled. Recent lipid tests were reviewed and are normal. Exacerbating diseases include diabetes and obesity. He has no history of chronic renal disease, hypothyroidism, liver disease or nephrotic syndrome. Pertinent negatives include no chest pain or shortness of breath. Current antihyperlipidemic treatment includes statins. The current treatment provides moderate improvement of lipids. Risk factors for coronary artery disease include dyslipidemia and hypertension.  Hypertension This is a new problem. The current episode started more than 1 year ago. The problem has been waxing and waning since onset. The problem is controlled. Pertinent negatives include no blurred vision, chest pain, orthopnea, palpitations, peripheral edema or shortness of  breath. There are no associated agents to hypertension. Risk factors for coronary artery disease include dyslipidemia. Past treatments include ACE inhibitors. There is no history of chronic renal disease.   Lab Results  Component Value Date   NA 142 08/20/2021   K 4.6 08/20/2021   CO2 26 08/20/2021   GLUCOSE 129 (H) 08/20/2021   BUN 18 08/20/2021   CREATININE 1.41 (H) 08/20/2021   CALCIUM 9.4 08/20/2021   EGFR 50 (L) 08/20/2021   GFRNONAA 44 (L) 09/03/2020   Lab Results  Component Value Date   CHOL 152 12/17/2020   HDL 30 (L) 12/17/2020   LDLCALC 101 (H) 12/17/2020   TRIG 116 12/17/2020   CHOLHDL 4.9 04/01/2018   No results found for: TSH Lab Results  Component Value Date   HGBA1C 6.5 (H) 08/20/2021   Lab Results  Component Value Date   WBC 4.7 02/20/2015   HGB 15.1 02/20/2015   HCT 45.6 02/20/2015   MCV 91 02/20/2015   PLT 176 02/20/2015   Lab Results  Component Value Date   ALT 16 12/17/2020   AST 22 12/17/2020   ALKPHOS 95 12/17/2020   BILITOT 1.1 12/17/2020   No results found for: 25OHVITD2, 25OHVITD3, VD25OH   Review of Systems  Constitutional:  Negative for fatigue and weight loss.  Eyes:  Negative for blurred vision.  Respiratory:  Negative for shortness of breath.   Cardiovascular:  Negative for chest pain, palpitations and orthopnea.  Endocrine: Negative for polydipsia, polyphagia and polyuria.  Neurological:  Negative for weakness.   Patient Active Problem List   Diagnosis Date Noted   Atherosclerosis of abdominal aorta ([place]) 12/17/2020   Mixed hyperlipidemia 01/01/2017   HDL lipoprotein deficiency 01/01/2017   H/O hypercholesterolemia 12/20/2014   Flu vaccine need 12/20/2014   Need  for vaccination 36/62/9476   Umbilical hernia without obstruction and without gangrene 12/20/2014   Familial multiple lipoprotein-type hyperlipidemia 12/08/2014   Alimentary obesity 12/08/2014   Type 2 diabetes mellitus with complication, without long-term current  use of insulin ([place]) 12/08/2014   Essential (primary) hypertension 12/08/2014   Blood in the urine 12/08/2014   Routine general medical examination at a health care facility 12/08/2014   Screening for depression 12/08/2014    No Known Allergies  Past Surgical History:  Procedure Laterality Date   CATARACT EXTRACTION Bilateral 2003   CYSTOSCOPY W/ RETROGRADES Bilateral 02/26/2015   Procedure: CYSTOSCOPY WITH RETROGRADE PYELOGRAM;  Surgeon: Hollice Espy, MD;  Location: [facility] ORS;  Service: Urology;  Laterality: Bilateral;   HERNIA REPAIR  2015    Social History   Tobacco Use   Smoking status: Former    Types: Cigarettes    Quit date: 02/23/1975    Years since quitting: 46.8   Smokeless tobacco: Former    Types: Chew    Quit date: 08/28/2018   Tobacco comments:    smoked for only 6 mos out of his whole life  Vaping Use   Vaping Use: Never used  Substance Use Topics   Alcohol use: No    Alcohol/week: 0.0 standard drinks   Drug use: No     Medication list has been reviewed and updated.  Current Meds  Medication Sig   aspirin 81 MG EC tablet Take 1 tablet (81 mg total) by mouth daily. Swallow whole.   atorvastatin (LIPITOR) 40 MG tablet Take 1 tablet (40 mg total) by mouth daily.   lisinopril (ZESTRIL) 5 MG tablet Take 1 tablet (5 mg total) by mouth daily.   metFORMIN (GLUCOPHAGE-XR) 500 MG 24 hr tablet TAKE 1 TABLET BY MOUTH EVERY DAY WITH BREAKFAST   Omega-3 Fatty Acids (FISH OIL PO) Take 1 capsule by mouth daily. Reported on 08/08/2015   pioglitazone (ACTOS) 15 MG tablet Take 1 tablet (15 mg total) by mouth daily.       12/18/2021   10:14 AM 08/20/2021    9:24 AM 04/19/2021    9:30 AM 12/17/2020    9:04 AM  GAD 7 : Generalized Anxiety Score  Nervous, Anxious, on Edge 0 0 0 0  Control/stop worrying 0 0 0 0  Worry too much - different things 0 0 0 0  Trouble relaxing 0 0 0 0  Restless 0 0 0 0  Easily annoyed or irritable 0 0 0 0  Afraid - awful might happen 0 0 0 0   Total GAD 7 Score 0 0 0 0  Anxiety Difficulty Not difficult at all Not difficult at all         12/18/2021   10:14 AM  Depression screen PHQ 2/9  Decreased Interest 0  Down, Depressed, Hopeless 0  PHQ - 2 Score 0  Altered sleeping 0  Tired, decreased energy 0  Change in appetite 0  Feeling bad or failure about yourself  0  Trouble concentrating 0  Moving slowly or fidgety/restless 0  Suicidal thoughts 0  PHQ-9 Score 0  Difficult doing work/chores Not difficult at all    BP Readings from Last 3 Encounters:  12/18/21 130/80  11/20/21 132/62  08/20/21 132/70    Physical Exam Vitals and nursing note reviewed.  HENT:     Head: Normocephalic.     Right Ear: Tympanic membrane and external ear normal.     Left Ear: Tympanic membrane and external ear normal.  Nose: Nose normal.  Eyes:     General: No scleral icterus.       Right eye: No discharge.        Left eye: No discharge.     Conjunctiva/sclera: Conjunctivae normal.     Pupils: Pupils are equal, round, and reactive to light.  Neck:     Thyroid: No thyromegaly.     Vascular: No JVD.     Trachea: No tracheal deviation.  Cardiovascular:     Rate and Rhythm: Normal rate and regular rhythm.     Pulses: Normal pulses.     Heart sounds: Normal heart sounds, S1 normal and S2 normal. No murmur heard. No systolic murmur is present.  No diastolic murmur is present.    No friction rub. No gallop. No S3 or S4 sounds.  Pulmonary:     Effort: No respiratory distress.     Breath sounds: Normal breath sounds. No wheezing or rales.  Abdominal:     General: Bowel sounds are normal.     Palpations: Abdomen is soft. There is no mass.     Tenderness: There is no abdominal tenderness. There is no guarding or rebound.  Musculoskeletal:        General: No tenderness. Normal range of motion.     Cervical back: Normal range of motion and neck supple.  Lymphadenopathy:     Cervical: No cervical adenopathy.  Skin:    General:  Skin is warm.     Findings: No rash.  Neurological:     Mental Status: He is alert and oriented to person, place, and time.     Cranial Nerves: No cranial nerve deficit.     Deep Tendon Reflexes: Reflexes are normal and symmetric.    Wt Readings from Last 3 Encounters:  12/18/21 276 lb (125.2 kg)  11/20/21 281 lb 12.8 oz (127.8 kg)  08/20/21 284 lb (128.8 kg)    BP 130/80   Pulse 80   Ht _0  (1.854 m)   Wt 276 lb (125.2 kg)   BMI 36.41 kg/m   Assessment and Plan:  1. Type 2 diabetes mellitus with complication, without long-term current use of insulin (HCC) Chronic.  Controlled.  Stable.  Patient relates no episodes of hypoglycemia.  We will continue metformin XR 500 mg once a day and pioglitazone 15 mg once a day.  Will check A1c for current level of control. - metFORMIN (GLUCOPHAGE-XR) 500 MG 24 hr tablet; TAKE 1 TABLET BY MOUTH EVERY DAY WITH BREAKFAST  Dispense: 90 tablet; Refill: 1 - pioglitazone (ACTOS) 15 MG tablet; Take 1 tablet (15 mg total) by mouth daily.  Dispense: 90 tablet; Refill: 1 - HgB A1c  2. Atherosclerosis of abdominal aorta [facility]) Patient with history of atherosclerosis.  Controlled with controlled diabetes control of hypertension and control of hyperlipidemia. - Lipid Panel With LDL/HDL Ratio  3. Familial multiple lipoprotein-type hyperlipidemia Patient currently on a statin agent for control and will check lipid panel for current level of LDL management. - Lipid Panel With LDL/HDL Ratio

## 2021-12-19 LAB — LIPID PANEL WITH LDL/HDL RATIO
Cholesterol, Total: 131 mg/dL (ref 100–199)
HDL: 35 mg/dL — ABNORMAL LOW (ref >39)
LDL Chol Calc (NIH): 77 mg/dL (ref 0–99)
LDL/HDL Ratio: 2.2 ratio (ref 0.0–3.6)
Triglycerides: 98 mg/dL (ref 0–149)
VLDL Cholesterol Cal: 19 mg/dL (ref 5–40)

## 2021-12-19 LAB — HEMOGLOBIN A1C
Est. average glucose Bld gHb Est-mCnc: 126 mg/dL
Hgb A1c MFr Bld: 6 % — ABNORMAL HIGH (ref 4.8–5.6)

## 2022-02-26 ENCOUNTER — Other Ambulatory Visit: Payer: Self-pay | Admitting: Family Medicine

## 2022-02-26 DIAGNOSIS — I10 Essential (primary) hypertension: Secondary | ICD-10-CM

## 2022-04-01 ENCOUNTER — Other Ambulatory Visit: Payer: Self-pay | Admitting: Family Medicine

## 2022-04-01 DIAGNOSIS — E7849 Other hyperlipidemia: Secondary | ICD-10-CM

## 2022-04-02 NOTE — Telephone Encounter (Signed)
Requested Prescriptions  Pending Prescriptions Disp Refills  . atorvastatin (LIPITOR) 40 MG tablet [Pharmacy Med Name: ATORVASTATIN 40 MG TABLET] 90 tablet 1    Sig: TAKE 1 TABLET BY MOUTH EVERY DAY     Cardiovascular:  Antilipid - Statins Failed - 04/01/2022  2:16 AM      Failed - Lipid Panel in normal range within the last 12 months    Cholesterol, Total  Date Value Ref Range Status  12/18/2021 131 100 - 199 mg/dL Final   LDL Chol Calc (NIH)  Date Value Ref Range Status  12/18/2021 77 0 - 99 mg/dL Final   HDL  Date Value Ref Range Status  12/18/2021 35 (L) >39 mg/dL Final   Triglycerides  Date Value Ref Range Status  12/18/2021 98 0 - 149 mg/dL Final         Passed - Patient is not pregnant      Passed - Valid encounter within last 12 months    Recent Outpatient Visits          3 months ago Type 2 diabetes mellitus with complication, without long-term current use of insulin ([place])   [place] [facility] [place], Deanna C, MD   7 months ago Essential (primary) hypertension   [place] [facility] [facility] at [facility], MD   11 months ago Type 2 diabetes mellitus with complication, without long-term current use of insulin ([place])   [place] [facility] at [facility], MD   1 year ago Type 2 diabetes mellitus with complication, without long-term current use of insulin ([place])   [place] [facility] at [place], Deanna C, MD   1 year ago Type 2 diabetes mellitus with complication, without long-term current use of insulin ([place])   [place] [facility] at [place], Edgar, MD      Future Appointments            In 2 weeks Juline Patch, MD [facility] at [facility], [facility]

## 2022-04-21 ENCOUNTER — Ambulatory Visit (INDEPENDENT_AMBULATORY_CARE_PROVIDER_SITE_OTHER): Payer: PPO | Admitting: Family Medicine

## 2022-04-21 ENCOUNTER — Encounter: Payer: Self-pay | Admitting: Family Medicine

## 2022-04-21 VITALS — BP 128/60 | HR 64 | Ht 73.0 in | Wt 272.0 lb

## 2022-04-21 DIAGNOSIS — Z23 Encounter for immunization: Secondary | ICD-10-CM

## 2022-04-21 DIAGNOSIS — E118 Type 2 diabetes mellitus with unspecified complications: Secondary | ICD-10-CM | POA: Diagnosis not present

## 2022-04-21 MED ORDER — PIOGLITAZONE HCL 15 MG PO TABS: 15.0000 mg | ORAL_TABLET | Freq: Every day | ORAL | 1 refills | Status: DC

## 2022-04-21 MED ORDER — METFORMIN HCL ER 500 MG PO TB24: ORAL_TABLET | ORAL | 1 refills | Status: DC

## 2022-04-21 NOTE — Progress Notes (Signed)
Date:  04/21/2022   Name:  Troy Rodriguez   DOB:  09/24/1940   MRN:  [id number]   Chief Complaint: Diabetes and Flu Vaccine  Diabetes He presents for his follow-up diabetic visit. He has type 2 diabetes mellitus. His disease course has been stable. There are no hypoglycemic associated symptoms. Pertinent negatives for hypoglycemia include no dizziness, headaches or nervousness/anxiousness. Pertinent negatives for diabetes include no blurred vision, no chest pain, no fatigue, no foot paresthesias, no foot ulcerations, no polydipsia, no polyphagia, no polyuria, no visual change, no weakness and no weight loss. There are no hypoglycemic complications. Symptoms are stable. There are no diabetic complications. There are no known risk factors for coronary artery disease. Current diabetic treatment includes oral agent (dual therapy). He is compliant with treatment all of the time. Meal planning includes avoidance of concentrated sweets and carbohydrate counting. He participates in exercise daily. His breakfast blood glucose is taken between 8-9 am. His breakfast blood glucose range is generally 130-140 mg/dl. An ACE inhibitor/angiotensin II receptor blocker is being taken.    Lab Results  Component Value Date   NA 142 08/20/2021   K 4.6 08/20/2021   CO2 26 08/20/2021   GLUCOSE 129 (H) 08/20/2021   BUN 18 08/20/2021   CREATININE 1.41 (H) 08/20/2021   CALCIUM 9.4 08/20/2021   EGFR 50 (L) 08/20/2021   GFRNONAA 44 (L) 09/03/2020   Lab Results  Component Value Date   CHOL 131 12/18/2021   HDL 35 (L) 12/18/2021   LDLCALC 77 12/18/2021   TRIG 98 12/18/2021   CHOLHDL 4.9 04/01/2018   No results found for: "TSH" Lab Results  Component Value Date   HGBA1C 6.0 (H) 12/18/2021   Lab Results  Component Value Date   WBC 4.7 02/20/2015   HGB 15.1 02/20/2015   HCT 45.6 02/20/2015   MCV 91 02/20/2015   PLT 176 02/20/2015   Lab Results  Component Value Date   ALT 16 12/17/2020   AST 22  12/17/2020   ALKPHOS 95 12/17/2020   BILITOT 1.1 12/17/2020   No results found for: "25OHVITD2", "25OHVITD3", "VD25OH"   Review of Systems  Constitutional:  Negative for chills, fatigue, fever and weight loss.  HENT:  Negative for drooling, ear discharge, ear pain and sore throat.   Eyes:  Negative for blurred vision.  Respiratory:  Negative for cough, shortness of breath and wheezing.   Cardiovascular:  Negative for chest pain, palpitations and leg swelling.  Gastrointestinal:  Negative for abdominal pain, blood in stool, constipation, diarrhea and nausea.  Endocrine: Negative for polydipsia, polyphagia and polyuria.  Genitourinary:  Negative for dysuria, frequency, hematuria and urgency.  Musculoskeletal:  Negative for back pain, myalgias and neck pain.  Skin:  Negative for rash.  Allergic/Immunologic: Negative for environmental allergies.  Neurological:  Negative for dizziness, weakness and headaches.  Hematological:  Does not bruise/bleed easily.  Psychiatric/Behavioral:  Negative for suicidal ideas. The patient is not nervous/anxious.     Patient Active Problem List   Diagnosis Date Noted   Atherosclerosis of abdominal aorta ([place]) 12/17/2020   Mixed hyperlipidemia 01/01/2017   HDL lipoprotein deficiency 01/01/2017   H/O hypercholesterolemia 12/20/2014   Flu vaccine need 12/20/2014   Need for vaccination 27/74/1287   Umbilical hernia without obstruction and without gangrene 12/20/2014   Familial multiple lipoprotein-type hyperlipidemia 12/08/2014   Alimentary obesity 12/08/2014   Type 2 diabetes mellitus with complication, without long-term current use of insulin ([place]) 12/08/2014   Essential (primary) hypertension  12/08/2014   Blood in the urine 12/08/2014   Routine general medical examination at a health care facility 12/08/2014   Screening for depression 12/08/2014    No Known Allergies  Past Surgical History:  Procedure Laterality Date   CATARACT EXTRACTION  Bilateral 2003   CYSTOSCOPY W/ RETROGRADES Bilateral 02/26/2015   Procedure: CYSTOSCOPY WITH RETROGRADE PYELOGRAM;  Surgeon: Hollice Espy, MD;  Location: [facility] ORS;  Service: Urology;  Laterality: Bilateral;   HERNIA REPAIR  2015    Social History   Tobacco Use   Smoking status: Former    Types: Cigarettes    Quit date: 02/23/1975    Years since quitting: 47.1   Smokeless tobacco: Former    Types: Chew    Quit date: 08/28/2018   Tobacco comments:    smoked for only 6 mos out of his whole life  Vaping Use   Vaping Use: Never used  Substance Use Topics   Alcohol use: No    Alcohol/week: 0.0 standard drinks of alcohol   Drug use: No     Medication list has been reviewed and updated.  Current Meds  Medication Sig   aspirin 81 MG EC tablet Take 1 tablet (81 mg total) by mouth daily. Swallow whole.   atorvastatin (LIPITOR) 40 MG tablet TAKE 1 TABLET BY MOUTH EVERY DAY   lisinopril (ZESTRIL) 5 MG tablet TAKE 1 TABLET (5 MG TOTAL) BY MOUTH DAILY.   metFORMIN (GLUCOPHAGE-XR) 500 MG 24 hr tablet TAKE 1 TABLET BY MOUTH EVERY DAY WITH BREAKFAST   Omega-3 Fatty Acids (FISH OIL PO) Take 1 capsule by mouth daily. Reported on 08/08/2015   pioglitazone (ACTOS) 15 MG tablet Take 1 tablet (15 mg total) by mouth daily.       04/21/2022   10:33 AM 12/18/2021   10:14 AM 08/20/2021    9:24 AM 04/19/2021    9:30 AM  GAD 7 : Generalized Anxiety Score  Nervous, Anxious, on Edge 0 0 0 0  Control/stop worrying 0 0 0 0  Worry too much - different things 0 0 0 0  Trouble relaxing 0 0 0 0  Restless 0 0 0 0  Easily annoyed or irritable 0 0 0 0  Afraid - awful might happen 0 0 0 0  Total GAD 7 Score 0 0 0 0  Anxiety Difficulty Not difficult at all Not difficult at all Not difficult at all        04/21/2022   10:33 AM 12/18/2021   10:14 AM 11/20/2021   11:27 AM  Depression screen PHQ 2/9  Decreased Interest 0 0 0  Down, Depressed, Hopeless 0 0 0  PHQ - 2 Score 0 0 0  Altered sleeping 0 0 0   Tired, decreased energy 0 0 0  Change in appetite 0 0 0  Feeling bad or failure about yourself  0 0 0  Trouble concentrating 0 0 0  Moving slowly or fidgety/restless 0 0 0  Suicidal thoughts 0 0 0  PHQ-9 Score 0 0 0  Difficult doing work/chores Not difficult at all Not difficult at all Not difficult at all    BP Readings from Last 3 Encounters:  04/21/22 128/60  12/18/21 130/80  11/20/21 132/62    Physical Exam Vitals and nursing note reviewed.  HENT:     Head: Normocephalic.     Right Ear: External ear normal.     Left Ear: External ear normal.     Nose: Nose normal.  Eyes:  General: No scleral icterus.       Right eye: No discharge.        Left eye: No discharge.     Conjunctiva/sclera: Conjunctivae normal.     Pupils: Pupils are equal, round, and reactive to light.  Neck:     Thyroid: No thyromegaly.     Vascular: No JVD.     Trachea: No tracheal deviation.  Cardiovascular:     Rate and Rhythm: Normal rate and regular rhythm.     Heart sounds: Normal heart sounds. No murmur heard.    No friction rub. No gallop.  Pulmonary:     Effort: No respiratory distress.     Breath sounds: Normal breath sounds. No wheezing or rales.  Abdominal:     Palpations: Abdomen is soft. There is no hepatomegaly or splenomegaly.     Tenderness: There is no abdominal tenderness.  Musculoskeletal:        General: No tenderness. Normal range of motion.     Cervical back: Normal range of motion and neck supple.  Lymphadenopathy:     Cervical: No cervical adenopathy.  Skin:    General: Skin is warm.     Findings: No rash.  Neurological:     Mental Status: He is alert and oriented to person, place, and time.     Cranial Nerves: No cranial nerve deficit.     Deep Tendon Reflexes: Reflexes are normal and symmetric.     Wt Readings from Last 3 Encounters:  04/21/22 272 lb (123.4 kg)  12/18/21 276 lb (125.2 kg)  11/20/21 281 lb 12.8 oz (127.8 kg)    BP 128/60   Pulse 64   Ht  '6\' 1"'  (1.854 m)   Wt 272 lb (123.4 kg)   BMI 35.89 kg/m   Assessment and Plan:  1. Type 2 diabetes mellitus with complication, without long-term current use of insulin (HCC) Chronic.  Controlled.  Stable.  Continue metformin XR 500 mg once a day.  And pioglitazone 15 mg once a day.  We will check A1c and microalbuminuria.  Foot exam was unremarkable.  We will also check CMP for electrolytes and GFR. - metFORMIN (GLUCOPHAGE-XR) 500 MG 24 hr tablet; TAKE 1 TABLET BY MOUTH EVERY DAY WITH BREAKFAST  Dispense: 90 tablet; Refill: 1 - pioglitazone (ACTOS) 15 MG tablet; Take 1 tablet (15 mg total) by mouth daily.  Dispense: 90 tablet; Refill: 1 - HgB A1c - Microalbumin / creatinine urine ratio - Comprehensive Metabolic Panel (CMET)  2. Need for immunization against influenza Discussed and administered - Flu Vaccine QUAD High Dose(Fluad)    Otilio Miu, MD

## 2022-04-22 LAB — COMPREHENSIVE METABOLIC PANEL
ALT: 16 IU/L (ref 0–44)
AST: 27 IU/L (ref 0–40)
Albumin/Globulin Ratio: 1.8 (ref 1.2–2.2)
Albumin: 4.1 g/dL (ref 3.7–4.7)
Alkaline Phosphatase: 84 IU/L (ref 44–121)
BUN/Creatinine Ratio: 11 (ref 10–24)
BUN: 18 mg/dL (ref 8–27)
Bilirubin Total: 1.2 mg/dL (ref 0.0–1.2)
CO2: 21 mmol/L (ref 20–29)
Calcium: 9.4 mg/dL (ref 8.6–10.2)
Chloride: 106 mmol/L (ref 96–106)
Creatinine, Ser: 1.64 mg/dL — ABNORMAL HIGH (ref 0.76–1.27)
Globulin, Total: 2.3 g/dL (ref 1.5–4.5)
Glucose: 136 mg/dL — ABNORMAL HIGH (ref 70–99)
Potassium: 4.6 mmol/L (ref 3.5–5.2)
Sodium: 143 mmol/L (ref 134–144)
Total Protein: 6.4 g/dL (ref 6.0–8.5)
eGFR: 41 mL/min/{1.73_m2} — ABNORMAL LOW (ref >59)

## 2022-04-22 LAB — MICROALBUMIN / CREATININE URINE RATIO
Creatinine, Urine: 126.2 mg/dL
Microalb/Creat Ratio: 22 mg/g creat (ref 0–29)
Microalbumin, Urine: 27.2 ug/mL

## 2022-04-22 LAB — HEMOGLOBIN A1C
Est. average glucose Bld gHb Est-mCnc: 143 mg/dL
Hgb A1c MFr Bld: 6.6 % — ABNORMAL HIGH (ref 4.8–5.6)

## 2022-05-27 ENCOUNTER — Other Ambulatory Visit: Payer: Self-pay | Admitting: Family Medicine

## 2022-05-27 DIAGNOSIS — I10 Essential (primary) hypertension: Secondary | ICD-10-CM

## 2022-08-21 ENCOUNTER — Ambulatory Visit (INDEPENDENT_AMBULATORY_CARE_PROVIDER_SITE_OTHER): Payer: PPO | Admitting: Family Medicine

## 2022-08-21 ENCOUNTER — Encounter: Payer: Self-pay | Admitting: Family Medicine

## 2022-08-21 VITALS — BP 128/60 | HR 60 | Ht 73.0 in | Wt 284.0 lb

## 2022-08-21 DIAGNOSIS — E118 Type 2 diabetes mellitus with unspecified complications: Secondary | ICD-10-CM | POA: Diagnosis not present

## 2022-08-21 MED ORDER — PIOGLITAZONE HCL 15 MG PO TABS: 15.0000 mg | ORAL_TABLET | Freq: Every day | ORAL | 1 refills | Status: DC

## 2022-08-21 MED ORDER — METFORMIN HCL ER 500 MG PO TB24
ORAL_TABLET | ORAL | 1 refills | Status: DC
Start: 2022-08-21 — End: 2022-12-23

## 2022-08-21 NOTE — Progress Notes (Signed)
Date:  08/21/2022   Name:  Troy Rodriguez   DOB:  March 29, 1941   MRN:  [id number]   Chief Complaint: Diabetes  Diabetes He presents for his follow-up diabetic visit. He has type 2 diabetes mellitus. His disease course has been stable. There are no hypoglycemic associated symptoms. Pertinent negatives for hypoglycemia include no dizziness, headaches or nervousness/anxiousness. Pertinent negatives for diabetes include no blurred vision, no chest pain, no fatigue, no foot paresthesias, no foot ulcerations, no polydipsia, no polyphagia, no polyuria, no visual change, no weakness and no weight loss. There are no hypoglycemic complications. Symptoms are stable. There are no diabetic complications. There are no known risk factors for coronary artery disease. Current diabetic treatment includes oral agent (dual therapy). He is compliant with treatment all of the time. He is following a generally healthy diet. Meal planning includes avoidance of concentrated sweets and carbohydrate counting. He participates in exercise daily. There is no change in his home blood glucose trend. An ACE inhibitor/angiotensin II receptor blocker is being taken. He does not see a podiatrist.Eye exam is not current.    Lab Results  Component Value Date   NA 143 04/21/2022   K 4.6 04/21/2022   CO2 21 04/21/2022   GLUCOSE 136 (H) 04/21/2022   BUN 18 04/21/2022   CREATININE 1.64 (H) 04/21/2022   CALCIUM 9.4 04/21/2022   EGFR 41 (L) 04/21/2022   GFRNONAA 44 (L) 09/03/2020   Lab Results  Component Value Date   CHOL 131 12/18/2021   HDL 35 (L) 12/18/2021   LDLCALC 77 12/18/2021   TRIG 98 12/18/2021   CHOLHDL 4.9 04/01/2018   No results found for: "TSH" Lab Results  Component Value Date   HGBA1C 6.6 (H) 04/21/2022   Lab Results  Component Value Date   WBC 4.7 02/20/2015   HGB 15.1 02/20/2015   HCT 45.6 02/20/2015   MCV 91 02/20/2015   PLT 176 02/20/2015   Lab Results  Component Value Date   ALT 16 04/21/2022    AST 27 04/21/2022   ALKPHOS 84 04/21/2022   BILITOT 1.2 04/21/2022   No results found for: "25OHVITD2", "25OHVITD3", "VD25OH"   Review of Systems  Constitutional:  Negative for chills, fatigue, fever and weight loss.  HENT:  Negative for drooling, ear discharge, ear pain and sore throat.   Eyes:  Negative for blurred vision.  Respiratory:  Negative for cough, shortness of breath and wheezing.   Cardiovascular:  Negative for chest pain, palpitations and leg swelling.  Gastrointestinal:  Negative for abdominal pain, blood in stool, constipation, diarrhea and nausea.  Endocrine: Negative for polydipsia, polyphagia and polyuria.  Genitourinary:  Negative for dysuria, frequency, hematuria and urgency.  Musculoskeletal:  Negative for back pain, myalgias and neck pain.  Skin:  Negative for rash.  Allergic/Immunologic: Negative for environmental allergies.  Neurological:  Negative for dizziness, weakness and headaches.  Hematological:  Does not bruise/bleed easily.  Psychiatric/Behavioral:  Negative for suicidal ideas. The patient is not nervous/anxious.     Patient Active Problem List   Diagnosis Date Noted   Atherosclerosis of abdominal aorta ([place]) 12/17/2020   Mixed hyperlipidemia 01/01/2017   HDL lipoprotein deficiency 01/01/2017   H/O hypercholesterolemia 12/20/2014   Flu vaccine need 12/20/2014   Need for vaccination 40/98/1191   Umbilical hernia without obstruction and without gangrene 12/20/2014   Familial multiple lipoprotein-type hyperlipidemia 12/08/2014   Alimentary obesity 12/08/2014   Type 2 diabetes mellitus with complication, without long-term current use of insulin ([place])  12/08/2014   Essential (primary) hypertension 12/08/2014   Blood in the urine 12/08/2014   Routine general medical examination at a health care facility 12/08/2014   Screening for depression 12/08/2014    No Known Allergies  Past Surgical History:  Procedure Laterality Date   CATARACT  EXTRACTION Bilateral 2003   CYSTOSCOPY W/ RETROGRADES Bilateral 02/26/2015   Procedure: CYSTOSCOPY WITH RETROGRADE PYELOGRAM;  Surgeon: Hollice Espy, MD;  Location: [facility] ORS;  Service: Urology;  Laterality: Bilateral;   HERNIA REPAIR  2015    Social History   Tobacco Use   Smoking status: Former    Types: Cigarettes    Quit date: 02/23/1975    Years since quitting: 47.5   Smokeless tobacco: Former    Types: Chew    Quit date: 08/28/2018   Tobacco comments:    smoked for only 6 mos out of his whole life  Vaping Use   Vaping Use: Never used  Substance Use Topics   Alcohol use: No    Alcohol/week: 0.0 standard drinks of alcohol   Drug use: No     Medication list has been reviewed and updated.  Current Meds  Medication Sig   aspirin 81 MG EC tablet Take 1 tablet (81 mg total) by mouth daily. Swallow whole.   atorvastatin (LIPITOR) 40 MG tablet TAKE 1 TABLET BY MOUTH EVERY DAY   lisinopril (ZESTRIL) 5 MG tablet TAKE 1 TABLET (5 MG TOTAL) BY MOUTH DAILY.   metFORMIN (GLUCOPHAGE-XR) 500 MG 24 hr tablet TAKE 1 TABLET BY MOUTH EVERY DAY WITH BREAKFAST   Omega-3 Fatty Acids (FISH OIL PO) Take 1 capsule by mouth daily. Reported on 08/08/2015   pioglitazone (ACTOS) 15 MG tablet Take 1 tablet (15 mg total) by mouth daily.       08/21/2022   10:17 AM 04/21/2022   10:33 AM 12/18/2021   10:14 AM 08/20/2021    9:24 AM  GAD 7 : Generalized Anxiety Score  Nervous, Anxious, on Edge 0 0 0 0  Control/stop worrying 0 0 0 0  Worry too much - different things 0 0 0 0  Trouble relaxing 0 0 0 0  Restless 0 0 0 0  Easily annoyed or irritable 0 0 0 0  Afraid - awful might happen 0 0 0 0  Total GAD 7 Score 0 0 0 0  Anxiety Difficulty Not difficult at all Not difficult at all Not difficult at all Not difficult at all       08/21/2022   10:17 AM 04/21/2022   10:33 AM 12/18/2021   10:14 AM  Depression screen PHQ 2/9  Decreased Interest 0 0 0  Down, Depressed, Hopeless 0 0 0  PHQ - 2 Score 0 0 0   Altered sleeping 0 0 0  Tired, decreased energy 0 0 0  Change in appetite 0 0 0  Feeling bad or failure about yourself  0 0 0  Trouble concentrating 0 0 0  Moving slowly or fidgety/restless 0 0 0  Suicidal thoughts 0 0 0  PHQ-9 Score 0 0 0  Difficult doing work/chores Not difficult at all Not difficult at all Not difficult at all    BP Readings from Last 3 Encounters:  08/21/22 128/60  04/21/22 128/60  12/18/21 130/80    Physical Exam Vitals and nursing note reviewed.  HENT:     Head: Normocephalic.     Right Ear: Tympanic membrane, ear canal and external ear normal. There is no impacted cerumen.  Left Ear: Tympanic membrane, ear canal and external ear normal. There is no impacted cerumen.     Nose: Nose normal. No congestion or rhinorrhea.     Mouth/Throat:     Mouth: Mucous membranes are moist.  Eyes:     General: No scleral icterus.       Right eye: No discharge.        Left eye: No discharge.     Conjunctiva/sclera: Conjunctivae normal.     Pupils: Pupils are equal, round, and reactive to light.  Neck:     Thyroid: No thyromegaly.     Vascular: No JVD.     Trachea: No tracheal deviation.  Cardiovascular:     Rate and Rhythm: Normal rate and regular rhythm.     Heart sounds: Normal heart sounds. No murmur heard.    No friction rub. No gallop.  Pulmonary:     Effort: No respiratory distress.     Breath sounds: Normal breath sounds. No wheezing, rhonchi or rales.  Abdominal:     General: Bowel sounds are normal.     Palpations: Abdomen is soft. There is no mass.     Tenderness: There is no abdominal tenderness. There is no guarding or rebound.  Musculoskeletal:        General: No tenderness. Normal range of motion.     Cervical back: Normal range of motion and neck supple.  Lymphadenopathy:     Cervical: No cervical adenopathy.  Skin:    General: Skin is warm.     Findings: No rash.  Neurological:     Mental Status: He is alert.     Wt Readings from  Last 3 Encounters:  08/21/22 284 lb (128.8 kg)  04/21/22 272 lb (123.4 kg)  12/18/21 276 lb (125.2 kg)    BP 128/60   Pulse 60   Ht '6\' 1"'$  (1.854 m)   Wt 284 lb (128.8 kg)   SpO2 95%   BMI 37.47 kg/m   Assessment and Plan: 1. Type 2 diabetes mellitus with complication, without long-term current use of insulin (HCC) Chronic.  Controlled.  Stable.  Currently taking metformin XR 500 mg once a day and pioglitazone 15 mg daily.  Will check A1c for current status of control and reemphasized limiting carbohydrates and eliminating concentrated sweets.  Will check patient in 4 months. - metFORMIN (GLUCOPHAGE-XR) 500 MG 24 hr tablet; TAKE 1 TABLET BY MOUTH EVERY DAY WITH BREAKFAST  Dispense: 90 tablet; Refill: 1 - pioglitazone (ACTOS) 15 MG tablet; Take 1 tablet (15 mg total) by mouth daily.  Dispense: 90 tablet; Refill: 1 - HgB A1c     Otilio Miu, MD

## 2022-08-22 LAB — HEMOGLOBIN A1C
Est. average glucose Bld gHb Est-mCnc: 131 mg/dL
Hgb A1c MFr Bld: 6.2 % — ABNORMAL HIGH (ref 4.8–5.6)

## 2022-08-22 NOTE — Progress Notes (Signed)
LVM to call back, PEC may give results. CRM created

## 2022-08-27 ENCOUNTER — Other Ambulatory Visit: Payer: Self-pay | Admitting: Family Medicine

## 2022-08-27 DIAGNOSIS — I10 Essential (primary) hypertension: Secondary | ICD-10-CM

## 2022-08-27 NOTE — Telephone Encounter (Signed)
Requested Prescriptions  Pending Prescriptions Disp Refills   lisinopril (ZESTRIL) 5 MG tablet [Pharmacy Med Name: LISINOPRIL 5 MG TABLET] 90 tablet 0    Sig: TAKE 1 TABLET (5 MG TOTAL) BY MOUTH DAILY.     Cardiovascular:  ACE Inhibitors Failed - 08/27/2022  1:37 AM      Failed - Cr in normal range and within 180 days    Creatinine  Date Value Ref Range Status  10/27/2012 1.18 0.60 - 1.30 mg/dL Final   Creatinine, Ser  Date Value Ref Range Status  04/21/2022 1.64 (H) 0.76 - 1.27 mg/dL Final         Passed - K in normal range and within 180 days    Potassium  Date Value Ref Range Status  04/21/2022 4.6 3.5 - 5.2 mmol/L Final  10/27/2012 3.9 3.5 - 5.1 mmol/L Final         Passed - Patient is not pregnant      Passed - Last BP in normal range    BP Readings from Last 1 Encounters:  08/21/22 128/60         Passed - Valid encounter within last 6 months    Recent Outpatient Visits           6 days ago Type 2 diabetes mellitus with complication, without long-term current use of insulin ([place])   [facility], MD   4 months ago Type 2 diabetes mellitus with complication, without long-term current use of insulin ([place])   [facility] at [facility], MD   8 months ago Type 2 diabetes mellitus with complication, without long-term current use of insulin ([place])   [facility] [place], Deanna C, MD   1 year ago Essential (primary) hypertension   [facility] [place], Deanna C, MD   1 year ago Type 2 diabetes mellitus with complication, without long-term current use of insulin [facility])   [place] [place], [place], MD       Future Appointments             In 3 months Juline Patch, MD [place]  at [facility], [facility]

## 2022-10-01 ENCOUNTER — Other Ambulatory Visit: Payer: Self-pay | Admitting: Family Medicine

## 2022-10-01 DIAGNOSIS — E7849 Other hyperlipidemia: Secondary | ICD-10-CM

## 2022-11-24 ENCOUNTER — Other Ambulatory Visit: Payer: Self-pay | Admitting: Family Medicine

## 2022-11-24 ENCOUNTER — Telehealth: Payer: Self-pay | Admitting: Family Medicine

## 2022-11-24 DIAGNOSIS — I10 Essential (primary) hypertension: Secondary | ICD-10-CM

## 2022-11-24 NOTE — Telephone Encounter (Signed)
Copied from CRM [id number]. Topic: Medicare AWV >> Nov 24, 2022  3:20 PM Payton Doughty wrote: Reason for CRM: Called patient to reschedule Medicare Annual Wellness Visit (AWV). Left message for patient to call back and schedule Medicare Annual Wellness Visit (AWV).    Please schedule an appointment at any time with Kennedy Bucker, LPN  .  If any questions, please contact me.  Thank you ,  Verlee Rossetti; Care Guide Ambulatory Clinical Support [place] [facility] Direct Dial: [phone]

## 2022-11-25 ENCOUNTER — Telehealth: Payer: Self-pay | Admitting: Family Medicine

## 2022-11-25 NOTE — Telephone Encounter (Signed)
Copied from CRM [id number]. Topic: Medicare AWV >> Nov 25, 2022  9:28 AM Payton Doughty wrote: Reason for CRM: Called patient to schedule Medicare Annual Wellness Visit (AWV). Left message for patient to call back and schedule Medicare Annual Wellness Visit (AWV).  Last date of AWV: 11/20/2021  Please schedule an appointment at any time with Donne Anon, CMA  .  If any questions, please contact me.  Thank you ,  Verlee Rossetti; Care Guide Ambulatory Clinical Support [place] [facility] Direct Dial: [phone]

## 2022-12-23 ENCOUNTER — Ambulatory Visit (INDEPENDENT_AMBULATORY_CARE_PROVIDER_SITE_OTHER): Payer: PPO | Admitting: Family Medicine

## 2022-12-23 ENCOUNTER — Encounter: Payer: Self-pay | Admitting: Family Medicine

## 2022-12-23 VITALS — BP 120/78 | HR 80 | Ht 73.0 in | Wt 278.0 lb

## 2022-12-23 DIAGNOSIS — E7849 Other hyperlipidemia: Secondary | ICD-10-CM

## 2022-12-23 DIAGNOSIS — E118 Type 2 diabetes mellitus with unspecified complications: Secondary | ICD-10-CM | POA: Diagnosis not present

## 2022-12-23 DIAGNOSIS — Z7984 Long term (current) use of oral hypoglycemic drugs: Secondary | ICD-10-CM | POA: Diagnosis not present

## 2022-12-23 MED ORDER — METFORMIN HCL ER 500 MG PO TB24: ORAL_TABLET | ORAL | 1 refills | Status: DC

## 2022-12-23 MED ORDER — PIOGLITAZONE HCL 15 MG PO TABS: 15.0000 mg | ORAL_TABLET | Freq: Every day | ORAL | 1 refills | Status: DC

## 2022-12-23 NOTE — Progress Notes (Signed)
Date:  12/23/2022   Name:  Troy Rodriguez   DOB:  04/21/1941   MRN:  [id number]   Chief Complaint: Hyperlipidemia and Diabetes  Hyperlipidemia This is a chronic problem. The current episode started more than 1 year ago. The problem is controlled. Recent lipid tests were reviewed and are normal. He has no history of diabetes, hypothyroidism or obesity. Pertinent negatives include no chest pain, myalgias or shortness of breath. Current antihyperlipidemic treatment includes statins. The current treatment provides moderate improvement of lipids. There are no compliance problems.  Risk factors for coronary artery disease include diabetes mellitus, dyslipidemia and hypertension.  Diabetes He presents for his follow-up diabetic visit. He has type 2 diabetes mellitus. His disease course has been stable. There are no hypoglycemic associated symptoms. Pertinent negatives for hypoglycemia include no dizziness, headaches or nervousness/anxiousness. There are no diabetic associated symptoms. Pertinent negatives for diabetes include no chest pain, no polydipsia and no polyuria. There are no hypoglycemic complications. Symptoms are stable. There are no diabetic complications. Pertinent negatives for diabetic complications include no CVA or PVD. There are no known risk factors for coronary artery disease. Current diabetic treatment includes oral agent (dual therapy). He is compliant with treatment all of the time. He is following a generally healthy diet. Meal planning includes avoidance of concentrated sweets and carbohydrate counting. He participates in exercise daily.    Lab Results  Component Value Date   NA 143 04/21/2022   K 4.6 04/21/2022   CO2 21 04/21/2022   GLUCOSE 136 (H) 04/21/2022   BUN 18 04/21/2022   CREATININE 1.64 (H) 04/21/2022   CALCIUM 9.4 04/21/2022   EGFR 41 (L) 04/21/2022   GFRNONAA 44 (L) 09/03/2020   Lab Results  Component Value Date   CHOL 131 12/18/2021   HDL 35 (L)  12/18/2021   LDLCALC 77 12/18/2021   TRIG 98 12/18/2021   CHOLHDL 4.9 04/01/2018   No results found for: "TSH" Lab Results  Component Value Date   HGBA1C 6.2 (H) 08/21/2022   Lab Results  Component Value Date   WBC 4.7 02/20/2015   HGB 15.1 02/20/2015   HCT 45.6 02/20/2015   MCV 91 02/20/2015   PLT 176 02/20/2015   Lab Results  Component Value Date   ALT 16 04/21/2022   AST 27 04/21/2022   ALKPHOS 84 04/21/2022   BILITOT 1.2 04/21/2022   No results found for: "25OHVITD2", "25OHVITD3", "VD25OH"   Review of Systems  Constitutional:  Negative for chills and fever.  HENT:  Negative for drooling, ear discharge, ear pain and sore throat.   Respiratory:  Negative for cough, shortness of breath and wheezing.   Cardiovascular:  Negative for chest pain, palpitations and leg swelling.  Gastrointestinal:  Negative for abdominal pain, blood in stool, constipation, diarrhea and nausea.  Endocrine: Negative for polydipsia and polyuria.  Genitourinary:  Negative for dysuria, frequency, hematuria and urgency.  Musculoskeletal:  Negative for back pain, myalgias and neck pain.  Skin:  Negative for rash.  Allergic/Immunologic: Negative for environmental allergies.  Neurological:  Negative for dizziness and headaches.  Hematological:  Does not bruise/bleed easily.  Psychiatric/Behavioral:  Negative for suicidal ideas. The patient is not nervous/anxious.     Patient Active Problem List   Diagnosis Date Noted   Atherosclerosis of abdominal aorta (HCC) 12/17/2020   Mixed hyperlipidemia 01/01/2017   HDL lipoprotein deficiency 01/01/2017   H/O hypercholesterolemia 12/20/2014   Flu vaccine need 12/20/2014   Need for vaccination 12/20/2014  Umbilical hernia without obstruction and without gangrene 12/20/2014   Familial multiple lipoprotein-type hyperlipidemia 12/08/2014   Alimentary obesity 12/08/2014   Type 2 diabetes mellitus with complication, without long-term current use of insulin  (HCC) 12/08/2014   Essential (primary) hypertension 12/08/2014   Blood in the urine 12/08/2014   Routine general medical examination at a health care facility 12/08/2014   Screening for depression 12/08/2014    No Known Allergies  Past Surgical History:  Procedure Laterality Date   CATARACT EXTRACTION Bilateral 2003   CYSTOSCOPY W/ RETROGRADES Bilateral 02/26/2015   Procedure: CYSTOSCOPY WITH RETROGRADE PYELOGRAM;  Surgeon: Vanna Scotland, MD;  Location: [facility];  Service: Urology;  Laterality: Bilateral;   HERNIA REPAIR  2015    Social History   Tobacco Use   Smoking status: Former    Types: Cigarettes    Quit date: 02/23/1975    Years since quitting: 47.8   Smokeless tobacco: Former    Types: Chew    Quit date: 08/28/2018   Tobacco comments:    smoked for only 6 mos out of his whole life  Vaping Use   Vaping Use: Never used  Substance Use Topics   Alcohol use: No    Alcohol/week: 0.0 standard drinks of alcohol   Drug use: No     Medication list has been reviewed and updated.  Current Meds  Medication Sig   aspirin 81 MG EC tablet Take 1 tablet (81 mg total) by mouth daily. Swallow whole.   atorvastatin (LIPITOR) 40 MG tablet TAKE 1 TABLET BY MOUTH EVERY DAY   lisinopril (ZESTRIL) 5 MG tablet TAKE 1 TABLET (5 MG TOTAL) BY MOUTH DAILY.   metFORMIN (GLUCOPHAGE-XR) 500 MG 24 hr tablet TAKE 1 TABLET BY MOUTH EVERY DAY WITH BREAKFAST   Omega-3 Fatty Acids (FISH OIL PO) Take 1 capsule by mouth daily. Reported on 08/08/2015   pioglitazone (ACTOS) 15 MG tablet Take 1 tablet (15 mg total) by mouth daily.       12/23/2022   10:27 AM 08/21/2022   10:17 AM 04/21/2022   10:33 AM 12/18/2021   10:14 AM  GAD 7 : Generalized Anxiety Score  Nervous, Anxious, on Edge 0 0 0 0  Control/stop worrying 0 0 0 0  Worry too much - different things 0 0 0 0  Trouble relaxing 0 0 0 0  Restless 0 0 0 0  Easily annoyed or irritable 0 0 0 0  Afraid - awful might happen 0 0 0 0  Total GAD 7  Score 0 0 0 0  Anxiety Difficulty Not difficult at all Not difficult at all Not difficult at all Not difficult at all       12/23/2022   10:26 AM 08/21/2022   10:17 AM 04/21/2022   10:33 AM  Depression screen PHQ 2/9  Decreased Interest 0 0 0  Down, Depressed, Hopeless 0 0 0  PHQ - 2 Score 0 0 0  Altered sleeping 0 0 0  Tired, decreased energy 0 0 0  Change in appetite 0 0 0  Feeling bad or failure about yourself  0 0 0  Trouble concentrating 0 0 0  Moving slowly or fidgety/restless 0 0 0  Suicidal thoughts 0 0 0  PHQ-9 Score 0 0 0  Difficult doing work/chores Not difficult at all Not difficult at all Not difficult at all    BP Readings from Last 3 Encounters:  12/23/22 120/78  08/21/22 128/60  04/21/22 128/60    Physical Exam  Vitals and nursing note reviewed.  HENT:     Head: Normocephalic.     Right Ear: Tympanic membrane and external ear normal.     Left Ear: Tympanic membrane and external ear normal.     Nose: Nose normal. No congestion or rhinorrhea.     Mouth/Throat:     Mouth: Mucous membranes are moist.  Eyes:     General: No scleral icterus.       Right eye: No discharge.        Left eye: No discharge.     Conjunctiva/sclera: Conjunctivae normal.     Pupils: Pupils are equal, round, and reactive to light.  Neck:     Thyroid: No thyromegaly.     Vascular: No JVD.     Trachea: No tracheal deviation.  Cardiovascular:     Rate and Rhythm: Normal rate and regular rhythm.     Heart sounds: Normal heart sounds, S1 normal and S2 normal. No murmur heard.    No systolic murmur is present.     No diastolic murmur is present.     No friction rub. No gallop. No S3 or S4 sounds.  Pulmonary:     Effort: No respiratory distress.     Breath sounds: Normal breath sounds. No wheezing, rhonchi or rales.  Abdominal:     General: Bowel sounds are normal.     Palpations: Abdomen is soft. There is no mass.     Tenderness: There is no abdominal tenderness. There is no  guarding or rebound.  Musculoskeletal:        General: No tenderness. Normal range of motion.     Cervical back: Neck supple.  Lymphadenopathy:     Cervical: No cervical adenopathy.  Skin:    General: Skin is warm.     Findings: No rash.  Neurological:     Mental Status: He is alert.     Wt Readings from Last 3 Encounters:  12/23/22 278 lb (126.1 kg)  08/21/22 284 lb (128.8 kg)  04/21/22 272 lb (123.4 kg)    BP 120/78   Pulse 80   Ht 6\' 1"  (1.854 m)   Wt 278 lb (126.1 kg)   SpO2 95%   BMI 36.68 kg/m   Assessment and Plan:  1. Type 2 diabetes mellitus with complication, without long-term current use of insulin (HCC) Chronic.  Controlled.  Stable.  Asymptomatic.  Currently on combination of metformin XR 500 mg once a day and pioglitazone 15 mg once a day.  Will check A1c for current status of control.  Discussed with patient the need for diabetic eye exam and he promises me that he will do it in the next 4 months.  Will recheck patient in 4 months. - metFORMIN (GLUCOPHAGE-XR) 500 MG 24 hr tablet; TAKE 1 TABLET BY MOUTH EVERY DAY WITH BREAKFAST  Dispense: 90 tablet; Refill: 1 - pioglitazone (ACTOS) 15 MG tablet; Take 1 tablet (15 mg total) by mouth daily.  Dispense: 90 tablet; Refill: 1 - HgB A1c  2. Familial multiple lipoprotein-type hyperlipidemia Patient is currently on atorvastatin 40 mg.  Will check lipid panel for current status of control. - Lipid Panel With LDL/HDL Ratio    Elizabeth Sauer, MD

## 2022-12-24 LAB — LIPID PANEL WITH LDL/HDL RATIO
Cholesterol, Total: 142 mg/dL (ref 100–199)
HDL: 35 mg/dL — ABNORMAL LOW (ref >39)
LDL Chol Calc (NIH): 90 mg/dL (ref 0–99)
LDL/HDL Ratio: 2.6 ratio (ref 0.0–3.6)
Triglycerides: 89 mg/dL (ref 0–149)
VLDL Cholesterol Cal: 17 mg/dL (ref 5–40)

## 2022-12-24 LAB — HEMOGLOBIN A1C
Est. average glucose Bld gHb Est-mCnc: 143 mg/dL
Hgb A1c MFr Bld: 6.6 % — ABNORMAL HIGH (ref 4.8–5.6)

## 2022-12-28 ENCOUNTER — Other Ambulatory Visit: Payer: Self-pay | Admitting: Family Medicine

## 2022-12-28 DIAGNOSIS — E7849 Other hyperlipidemia: Secondary | ICD-10-CM

## 2022-12-29 NOTE — Telephone Encounter (Signed)
Requested Prescriptions  Pending Prescriptions Disp Refills   atorvastatin (LIPITOR) 40 MG tablet [Pharmacy Med Name: ATORVASTATIN 40 MG TABLET] 90 tablet 0    Sig: TAKE 1 TABLET BY MOUTH EVERY DAY     Cardiovascular:  Antilipid - Statins Failed - 12/28/2022  1:47 AM      Failed - Lipid Panel in normal range within the last 12 months    Cholesterol, Total  Date Value Ref Range Status  12/23/2022 142 100 - 199 mg/dL Final   LDL Chol Calc (NIH)  Date Value Ref Range Status  12/23/2022 90 0 - 99 mg/dL Final   HDL  Date Value Ref Range Status  12/23/2022 35 (L) >39 mg/dL Final   Triglycerides  Date Value Ref Range Status  12/23/2022 89 0 - 149 mg/dL Final         Passed - Patient is not pregnant      Passed - Valid encounter within last 12 months    Recent Outpatient Visits           6 days ago Type 2 diabetes mellitus with complication, without long-term current use of insulin (HCC)   [facility], MD   4 months ago Type 2 diabetes mellitus with complication, without long-term current use of insulin (HCC)   [place] [facility], MD   8 months ago Type 2 diabetes mellitus with complication, without long-term current use of insulin (HCC)   [facility], MD   1 year ago Type 2 diabetes mellitus with complication, without long-term current use of insulin (HCC)   [facility], MD   1 year ago Essential (primary) hypertension   [place] [facility], MD       Future Appointments             In 4 months Duanne Limerick, MD [facility] at [facility], [facility]

## 2023-01-07 ENCOUNTER — Telehealth: Payer: Self-pay | Admitting: Family Medicine

## 2023-01-07 NOTE — Telephone Encounter (Signed)
Copied from CRM [id number]. Topic: Medicare AWV >> Jan 07, 2023  2:52 PM Payton Doughty wrote: Reason for CRM: LM 01/07/2023 to schedule AWV   Verlee Rossetti; Care Guide Ambulatory Clinical Support [place] [facility] Direct Dial: [phone]

## 2023-01-15 ENCOUNTER — Telehealth: Payer: Self-pay

## 2023-01-15 NOTE — Telephone Encounter (Signed)
Left VM asking patient to call and schedule wellness visit with NURSE A on a Wednesday MORNING.  Please schedule if patient returns call. 

## 2023-02-18 ENCOUNTER — Other Ambulatory Visit: Payer: Self-pay | Admitting: Family Medicine

## 2023-02-18 DIAGNOSIS — I10 Essential (primary) hypertension: Secondary | ICD-10-CM

## 2023-02-25 ENCOUNTER — Ambulatory Visit (INDEPENDENT_AMBULATORY_CARE_PROVIDER_SITE_OTHER): Payer: PPO

## 2023-02-25 VITALS — Ht 73.0 in | Wt 278.0 lb

## 2023-02-25 DIAGNOSIS — Z0000 Encounter for general adult medical examination without abnormal findings: Secondary | ICD-10-CM | POA: Diagnosis not present

## 2023-02-25 NOTE — Patient Instructions (Signed)
Troy Rodriguez , Thank you for taking time to come for your Medicare Wellness Visit. I appreciate your ongoing commitment to your health goals. Please review the following plan we discussed and let me know if I can assist you in the future.   These are the goals we discussed:  Goals       Exercise 3x per week (30 min per time) (pt-stated)      "Trying to clean out house- wife passed"      Increase water intake      Recommend drinking 4-5 glasses of water a day        This is a list of the screening recommended for you and due dates:  Health Maintenance  Topic Date Due   COVID-19 Vaccine (3 - 2023-24 season) 03/13/2023*   Eye exam for diabetics  06/25/2023*   Flu Shot  02/26/2023   Yearly kidney function blood test for diabetes  04/22/2023   Yearly kidney health urinalysis for diabetes  04/22/2023   Complete foot exam   04/22/2023   Hemoglobin A1C  06/25/2023   Medicare Annual Wellness Visit  02/25/2024   DTaP/Tdap/Td vaccine (2 - Td or Tdap) 08/26/2025   Pneumonia Vaccine  Completed   Zoster (Shingles) Vaccine  Completed   HPV Vaccine  Aged Out  *Topic was postponed. The date shown is not the original due date.   Health Maintenance After Age 50 After age 50, you are at a higher risk for certain long-term diseases and infections as well as injuries from falls. Falls are a major cause of broken bones and head injuries in people who are older than age 73. Getting regular preventive care can help to keep you healthy and well. Preventive care includes getting regular testing and making lifestyle changes as recommended by your health care provider. Talk with your health care provider about: Which screenings and tests you should have. A screening is a test that checks for a disease when you have no symptoms. A diet and exercise plan that is right for you. What should I know about screenings and tests to prevent falls? Screening and testing are the best ways to find a health problem early.  Early diagnosis and treatment give you the best chance of managing medical conditions that are common after age 47. Certain conditions and lifestyle choices may make you more likely to have a fall. Your health care provider may recommend: Regular vision checks. Poor vision and conditions such as cataracts can make you more likely to have a fall. If you wear glasses, make sure to get your prescription updated if your vision changes. Medicine review. Work with your health care provider to regularly review all of the medicines you are taking, including over-the-counter medicines. Ask your health care provider about any side effects that may make you more likely to have a fall. Tell your health care provider if any medicines that you take make you feel dizzy or sleepy. Strength and balance checks. Your health care provider may recommend certain tests to check your strength and balance while standing, walking, or changing positions. Foot health exam. Foot pain and numbness, as well as not wearing proper footwear, can make you more likely to have a fall. Screenings, including: Osteoporosis screening. Osteoporosis is a condition that causes the bones to get weaker and break more easily. Blood pressure screening. Blood pressure changes and medicines to control blood pressure can make you feel dizzy. Depression screening. You may be more likely to have a  fall if you have a fear of falling, feel depressed, or feel unable to do activities that you used to do. Alcohol use screening. Using too much alcohol can affect your balance and may make you more likely to have a fall. Follow these instructions at home: Lifestyle Do not drink alcohol if: Your health care provider tells you not to drink. If you drink alcohol: Limit how much you have to: 0-1 drink a day for women. 0-2 drinks a day for men. Know how much alcohol is in your drink. In the [place] one drink equals one 12 oz bottle of beer (355 mL), one 5 oz glass  of wine (148 mL), or one 1 oz glass of hard liquor (44 mL). Do not use any products that contain nicotine or tobacco. These products include cigarettes, chewing tobacco, and vaping devices, such as e-cigarettes. If you need help quitting, ask your health care provider. Activity  Follow a regular exercise program to stay fit. This will help you maintain your balance. Ask your health care provider what types of exercise are appropriate for you. If you need a cane or walker, use it as recommended by your health care provider. Wear supportive shoes that have nonskid soles. Safety  Remove any tripping hazards, such as rugs, cords, and clutter. Install safety equipment such as grab bars in bathrooms and safety rails on stairs. Keep rooms and walkways well-lit. General instructions Talk with your health care provider about your risks for falling. Tell your health care provider if: You fall. Be sure to tell your health care provider about all falls, even ones that seem minor. You feel dizzy, tiredness (fatigue), or off-balance. Take over-the-counter and prescription medicines only as told by your health care provider. These include supplements. Eat a healthy diet and maintain a healthy weight. A healthy diet includes low-fat dairy products, low-fat (lean) meats, and fiber from whole grains, beans, and lots of fruits and vegetables. Stay current with your vaccines. Schedule regular health, dental, and eye exams. Summary Having a healthy lifestyle and getting preventive care can help to protect your health and wellness after age 75. Screening and testing are the best way to find a health problem early and help you avoid having a fall. Early diagnosis and treatment give you the best chance for managing medical conditions that are more common for people who are older than age 66. Falls are a major cause of broken bones and head injuries in people who are older than age 31. Take precautions to prevent a  fall at home. Work with your health care provider to learn what changes you can make to improve your health and wellness and to prevent falls. This information is not intended to replace advice given to you by your health care provider. Make sure you discuss any questions you have with your health care provider. Document Revised: 12/03/2020 Document Reviewed: 12/03/2020 Elsevier Patient Education  2024 [employer].

## 2023-02-25 NOTE — Progress Notes (Signed)
Subjective:   Troy Rodriguez is a 82 y.o. male who presents for Medicare Annual/Subsequent preventive examination.  Visit Complete: Virtual  I connected with  Troy Rodriguez on 02/25/23 by a audio enabled telemedicine application and verified that I am speaking with the correct person using two identifiers.  Patient Location: Home  Provider Location: Office/Clinic  I discussed the limitations of evaluation and management by telemedicine. The patient expressed understanding and agreed to proceed.       Objective:    Today's Vitals   02/25/23 0918  Weight: 278 lb (126.1 kg)  Height: 6\' 1"  (1.854 m)   Body mass index is 36.68 kg/m.     02/25/2023    9:30 AM 11/20/2021   11:28 AM 11/19/2020   11:33 AM 01/01/2017    8:22 AM 12/29/2016    1:48 PM 02/26/2015    6:58 AM  Advanced Directives  Does Patient Have a Medical Advance Directive? Yes Yes Yes Yes No;Yes No  Type of Estate agent of [place];Living will Healthcare Power of [place];Living will Healthcare Power of [place];Living will  Healthcare Power of [place];Living will   Does patient want to make changes to medical advance directive? No - Patient declined       Copy of Healthcare Power of Attorney in Chart? No - copy requested Yes - validated most recent copy scanned in chart (See row information) Yes - validated most recent copy scanned in chart (See row information) Yes No - copy requested   Would patient like information on creating a medical advance directive?     Yes (MAU/Ambulatory/Procedural Areas - Information given)     Current Medications (verified) Outpatient Encounter Medications as of 02/25/2023  Medication Sig   aspirin 81 MG EC tablet Take 1 tablet (81 mg total) by mouth daily. Swallow whole.   atorvastatin (LIPITOR) 40 MG tablet TAKE 1 TABLET BY MOUTH EVERY DAY   lisinopril (ZESTRIL) 5 MG tablet TAKE 1 TABLET (5 MG TOTAL) BY MOUTH DAILY.   metFORMIN (GLUCOPHAGE-XR) 500 MG 24 hr tablet  TAKE 1 TABLET BY MOUTH EVERY DAY WITH BREAKFAST   Omega-3 Fatty Acids (FISH OIL PO) Take 1 capsule by mouth daily. Reported on 08/08/2015   pioglitazone (ACTOS) 15 MG tablet Take 1 tablet (15 mg total) by mouth daily.   No facility-administered encounter medications on file as of 02/25/2023.    Allergies (verified) Patient has no known allergies.   History: Past Medical History:  Diagnosis Date   Arthritis    Diabetes (HCC)    Hematuria    HLD (hyperlipidemia)    Umbilical hernia    Past Surgical History:  Procedure Laterality Date   CATARACT EXTRACTION Bilateral 2003   CYSTOSCOPY W/ RETROGRADES Bilateral 02/26/2015   Procedure: CYSTOSCOPY WITH RETROGRADE PYELOGRAM;  Surgeon: Vanna Scotland, MD;  Location: [facility];  Service: Urology;  Laterality: Bilateral;   HERNIA REPAIR  2015   Family History  Problem Relation Age of Onset   Prostate cancer Brother    Kidney disease Neg Hx    Social History   Socioeconomic History   Marital status: Widowed    Spouse name: Not on file   Number of children: Not on file   Years of education: Not on file   Highest education level: Not on file  Occupational History   Not on file  Tobacco Use   Smoking status: Former    Current packs/day: 0.00    Types: Cigarettes    Quit date:  02/23/1975    Years since quitting: 48.0   Smokeless tobacco: Former    Types: Chew    Quit date: 08/28/2018   Tobacco comments:    smoked for only 6 mos out of his whole life  Vaping Use   Vaping status: Never Used  Substance and Sexual Activity   Alcohol use: No    Alcohol/week: 0.0 standard drinks of alcohol   Drug use: No   Sexual activity: Not Currently  Other Topics Concern   Not on file  Social History Narrative   Caffeine use:2 per day.   Social Determinants of Health   Financial Resource Strain: Low Risk  (02/25/2023)   Overall Financial Resource Strain (CARDIA)    Difficulty of Paying Living Expenses: Not hard at all  Food Insecurity: No  Food Insecurity (02/25/2023)   Hunger Vital Sign    Worried About Running Out of Food in the Last Year: Never true    Ran Out of Food in the Last Year: Never true  Transportation Needs: No Transportation Needs (02/25/2023)   PRAPARE - Administrator, Civil Service (Medical): No    Lack of Transportation (Non-Medical): No  Physical Activity: Insufficiently Active (02/25/2023)   Exercise Vital Sign    Days of Exercise per Week: 3 days    Minutes of Exercise per Session: 30 min  Stress: No Stress Concern Present (02/25/2023)   [employer] of Occupational Health - Occupational Stress Questionnaire    Feeling of Stress : Only a little  Social Connections: Socially Isolated (02/25/2023)   Social Connection and Isolation Panel [NHANES]    Frequency of Communication with Friends and Family: Three times a week    Frequency of Social Gatherings with Friends and Family: Three times a week    Attends Religious Services: Never    Active Member of Clubs or Organizations: No    Attends Banker Meetings: Never    Marital Status: Widowed    Tobacco Counseling Counseling given: Yes Tobacco comments: smoked for only 6 mos out of his whole life   Clinical Intake:  Pre-visit preparation completed: No  Pain : No/denies pain     BMI - recorded: 36.68 Nutritional Status: BMI > 30  Obese Nutritional Risks: None Diabetes: Yes CBG done?: No Did pt. bring in CBG monitor from home?: No     Interpreter Needed?: No  Information entered by :: Arthur Holms   Activities of Daily Living    02/25/2023    9:24 AM  In your present state of health, do you have any difficulty performing the following activities:  Hearing? 0  Vision? 0  Difficulty concentrating or making decisions? 0  Walking or climbing stairs? 0  Dressing or bathing? 0  Doing errands, shopping? 0  Preparing Food and eating ? N  Using the Toilet? N  In the past six months, have you accidently leaked  urine? N  Do you have problems with loss of bowel control? N  Managing your Medications? N  Managing your Finances? N  Housekeeping or managing your Housekeeping? N    Patient Care Team: Duanne Limerick, MD as PCP - General (Family Medicine)  Indicate any recent Medical Services you may have received from other than [facility] providers in the past year (date may be approximate).     Assessment:   This is a routine wellness examination for Ramses.  Hearing/Vision screen Hearing Screening - Comments:: Pt hearing well over phone Vision Screening - Comments:: Wears  readers  Dietary issues and exercise activities discussed:     Goals Addressed               This Visit's Progress     Exercise 3x per week (30 min per time) (pt-stated)        "Trying to clean out house- wife passed"       Depression Screen    02/25/2023    9:32 AM 02/25/2023    9:29 AM 12/23/2022   10:26 AM 08/21/2022   10:17 AM 04/21/2022   10:33 AM 12/18/2021   10:14 AM 11/20/2021   11:27 AM  PHQ 2/9 Scores  PHQ - 2 Score 0 1 0 0 0 0 0  PHQ- 9 Score   0 0 0 0 0    Fall Risk    02/25/2023    9:31 AM 12/23/2022   10:26 AM 08/21/2022   10:17 AM 04/21/2022   10:32 AM 12/18/2021   10:15 AM  Fall Risk   Falls in the past year? 0 0 0 0 0  Number falls in past yr: 0 0 0 0 0  Injury with Fall? 0 0 0 0 0  Risk for fall due to : No Fall Risks No Fall Risks No Fall Risks No Fall Risks No Fall Risks  Follow up Falls evaluation completed Falls evaluation completed Falls evaluation completed Falls evaluation completed Falls evaluation completed    MEDICARE RISK AT HOME:  Medicare Risk at Home - 02/25/23 0931     Any stairs in or around the home? Yes    If so, are there any without handrails? No    Home free of loose throw rugs in walkways, pet beds, electrical cords, etc? Yes    Adequate lighting in your home to reduce risk of falls? Yes    Life alert? Yes    Use of a cane, walker or w/c? No    Grab bars in the  bathroom? Yes    Shower chair or bench in shower? Yes    Elevated toilet seat or a handicapped toilet? Yes               Cognitive Function:        02/25/2023    9:32 AM 11/19/2020   11:36 AM 12/29/2016    1:49 PM  6CIT Screen  What Year? 0 points 0 points 0 points  What month? 0 points 0 points 0 points  What time? 0 points 0 points 0 points  Count back from 20 0 points 0 points 0 points  Months in reverse 0 points 0 points 0 points  Repeat phrase 2 points 0 points 0 points  Total Score 2 points 0 points 0 points    Immunizations Immunization History  Administered Date(s) Administered   Fluad Quad(high Dose 65+) 05/31/2019, 04/03/2020, 04/19/2021, 04/21/2022   Influenza, High Dose Seasonal PF 08/04/2017, 04/01/2018   Influenza,inj,Quad PF,6+ Mos 07/03/2016   Influenza-Unspecified 05/22/2014   PFIZER(Purple Top)SARS-COV-2 Vaccination 08/16/2019, 09/10/2019   Pneumococcal Conjugate-13 11/13/2014   Pneumococcal Polysaccharide-23 12/29/2016   Tdap 08/27/2015   Zoster Recombinant(Shingrix) 03/11/2020, 05/09/2020   Zoster, Live 07/28/2013    TDAP status: Up to date  Flu Vaccine status: Up to date  Pneumococcal vaccine status: Due, Education has been provided regarding the importance of this vaccine. Advised may receive this vaccine at local pharmacy or Health Dept. Aware to provide a copy of the vaccination record if obtained from local pharmacy or Health Dept. Verbalized acceptance and  understanding.  Covid-19 vaccine status: Declined, Education has been provided regarding the importance of this vaccine but patient still declined. Advised may receive this vaccine at local pharmacy or Health Dept.or vaccine clinic. Aware to provide a copy of the vaccination record if obtained from local pharmacy or Health Dept. Verbalized acceptance and understanding.  Qualifies for Shingles Vaccine? No   Zostavax completed Yes   Shingrix Completed?: Yes  Screening Tests Health  Maintenance  Topic Date Due   COVID-19 Vaccine (3 - 2023-24 season) 03/13/2023 (Originally 03/28/2022)   OPHTHALMOLOGY EXAM  06/25/2023 (Originally 01/07/2022)   INFLUENZA VACCINE  02/26/2023   Diabetic kidney evaluation - eGFR measurement  04/22/2023   Diabetic kidney evaluation - Urine ACR  04/22/2023   FOOT EXAM  04/22/2023   HEMOGLOBIN A1C  06/25/2023   Medicare Annual Wellness (AWV)  02/25/2024   DTaP/Tdap/Td (2 - Td or Tdap) 08/26/2025   Pneumonia Vaccine 30+ Years old  Completed   Zoster Vaccines- Shingrix  Completed   HPV VACCINES  Aged Out    Health Maintenance  There are no preventive care reminders to display for this patient.   Colorectal cancer screening: Type of screening: Colonoscopy. Completed 11/02/2008. Repeat every 10 years- aged out  Lung Cancer Screening: (Low Dose CT Chest recommended if Age 57-80 years, 20 pack-year currently smoking OR have quit w/in 15years.) does not qualify.    Additional Screening:  Hepatitis C Screening: does qualify; Completed pt does not want  Vision Screening: Recommended annual ophthalmology exams for early detection of glaucoma and other disorders of the eye. Is the patient up to date with their annual eye exam?  No - going in August Who is the provider or what is the name of the office in which the patient attends annual eye exams? [facility] If pt is not established with a provider, would they like to be referred to a provider to establish care? No .   Dental Screening: Recommended annual dental exams for proper oral hygiene- no, doesn't desire  Diabetic Foot Exam: Diabetic Foot Exam: Completed 04/21/22  Community Resource Referral / Chronic Care Management: CRR required this visit?  No   CCM required this visit?  No     Plan:     I have personally reviewed and noted the following in the patient's chart:   Medical and social history Use of alcohol, tobacco or illicit drugs  Current medications and supplements  including opioid prescriptions. Patient is not currently taking opioid prescriptions. Functional ability and status Nutritional status Physical activity Advanced directives List of other physicians Hospitalizations, surgeries, and ER visits in previous 12 months Vitals Screenings to include cognitive, depression, and falls Referrals and appointments  In addition, I have reviewed and discussed with patient certain preventive protocols, quality metrics, and best practice recommendations. A written personalized care plan for preventive services as well as general preventive health recommendations were provided to patient.     Everitt Amber   02/25/2023   After Visit Summary: (MyChart) Due to this being a telephonic visit, the after visit summary with patients personalized plan was offered to patient via MyChart   Nurse Notes: none

## 2023-03-03 DIAGNOSIS — H524 Presbyopia: Secondary | ICD-10-CM | POA: Diagnosis not present

## 2023-03-03 DIAGNOSIS — H43813 Vitreous degeneration, bilateral: Secondary | ICD-10-CM | POA: Diagnosis not present

## 2023-03-03 DIAGNOSIS — E119 Type 2 diabetes mellitus without complications: Secondary | ICD-10-CM | POA: Diagnosis not present

## 2023-03-03 DIAGNOSIS — Z961 Presence of intraocular lens: Secondary | ICD-10-CM | POA: Diagnosis not present

## 2023-03-04 DIAGNOSIS — E785 Hyperlipidemia, unspecified: Secondary | ICD-10-CM | POA: Diagnosis not present

## 2023-03-04 DIAGNOSIS — I10 Essential (primary) hypertension: Secondary | ICD-10-CM | POA: Diagnosis not present

## 2023-03-04 DIAGNOSIS — E1169 Type 2 diabetes mellitus with other specified complication: Secondary | ICD-10-CM | POA: Diagnosis not present

## 2023-04-01 ENCOUNTER — Other Ambulatory Visit: Payer: Self-pay | Admitting: Family Medicine

## 2023-04-01 DIAGNOSIS — E7849 Other hyperlipidemia: Secondary | ICD-10-CM

## 2023-05-01 ENCOUNTER — Ambulatory Visit: Payer: PPO | Admitting: Family Medicine

## 2023-05-12 ENCOUNTER — Encounter: Payer: Self-pay | Admitting: Family Medicine

## 2023-05-12 ENCOUNTER — Ambulatory Visit (INDEPENDENT_AMBULATORY_CARE_PROVIDER_SITE_OTHER): Payer: PPO | Admitting: Family Medicine

## 2023-05-12 VITALS — BP 122/74 | HR 72 | Ht 73.0 in | Wt 272.6 lb

## 2023-05-12 DIAGNOSIS — E7849 Other hyperlipidemia: Secondary | ICD-10-CM | POA: Diagnosis not present

## 2023-05-12 DIAGNOSIS — I10 Essential (primary) hypertension: Secondary | ICD-10-CM | POA: Diagnosis not present

## 2023-05-12 DIAGNOSIS — Z7984 Long term (current) use of oral hypoglycemic drugs: Secondary | ICD-10-CM

## 2023-05-12 DIAGNOSIS — Z23 Encounter for immunization: Secondary | ICD-10-CM | POA: Diagnosis not present

## 2023-05-12 DIAGNOSIS — E118 Type 2 diabetes mellitus with unspecified complications: Secondary | ICD-10-CM | POA: Diagnosis not present

## 2023-05-12 MED ORDER — ATORVASTATIN CALCIUM 40 MG PO TABS
40.0000 mg | ORAL_TABLET | Freq: Every day | ORAL | 1 refills | Status: DC
Start: 2023-05-12 — End: 2023-09-14

## 2023-05-12 MED ORDER — LISINOPRIL 5 MG PO TABS
5.0000 mg | ORAL_TABLET | Freq: Every day | ORAL | 1 refills | Status: DC
Start: 2023-05-12 — End: 2023-09-14

## 2023-05-12 MED ORDER — METFORMIN HCL ER 500 MG PO TB24
ORAL_TABLET | ORAL | 1 refills | Status: DC
Start: 2023-05-12 — End: 2023-09-14

## 2023-05-12 MED ORDER — PIOGLITAZONE HCL 15 MG PO TABS
15.0000 mg | ORAL_TABLET | Freq: Every day | ORAL | 1 refills | Status: DC
Start: 2023-05-12 — End: 2023-09-14

## 2023-05-12 NOTE — Progress Notes (Signed)
Date:  05/12/2023   Name:  Troy Rodriguez   DOB:  14-Jul-1941   MRN:  [id number]   Chief Complaint: Diabetes (Med refill)  Diabetes He presents for his follow-up diabetic visit. He has type 2 diabetes mellitus. His disease course has been stable. There are no hypoglycemic associated symptoms. Pertinent negatives for hypoglycemia include no dizziness, headaches or nervousness/anxiousness. There are no diabetic associated symptoms. Pertinent negatives for diabetes include no chest pain and no polydipsia. There are no hypoglycemic complications. Symptoms are stable. There are no diabetic complications. Risk factors for coronary artery disease include dyslipidemia and hypertension. He is following a generally healthy diet. Meal planning includes avoidance of concentrated sweets and carbohydrate counting. An ACE inhibitor/angiotensin II receptor blocker is being taken.    Lab Results  Component Value Date   NA 143 04/21/2022   K 4.6 04/21/2022   CO2 21 04/21/2022   GLUCOSE 136 (H) 04/21/2022   BUN 18 04/21/2022   CREATININE 1.64 (H) 04/21/2022   CALCIUM 9.4 04/21/2022   EGFR 41 (L) 04/21/2022   GFRNONAA 44 (L) 09/03/2020   Lab Results  Component Value Date   CHOL 142 12/23/2022   HDL 35 (L) 12/23/2022   LDLCALC 90 12/23/2022   TRIG 89 12/23/2022   CHOLHDL 4.9 04/01/2018   No results found for: "TSH" Lab Results  Component Value Date   HGBA1C 6.6 (H) 12/23/2022   Lab Results  Component Value Date   WBC 4.7 02/20/2015   HGB 15.1 02/20/2015   HCT 45.6 02/20/2015   MCV 91 02/20/2015   PLT 176 02/20/2015   Lab Results  Component Value Date   ALT 16 04/21/2022   AST 27 04/21/2022   ALKPHOS 84 04/21/2022   BILITOT 1.2 04/21/2022   No results found for: "25OHVITD2", "25OHVITD3", "VD25OH"   Review of Systems  Constitutional:  Negative for chills and fever.  HENT:  Negative for drooling, ear discharge, ear pain and sore throat.   Respiratory:  Negative for cough, shortness  of breath and wheezing.   Cardiovascular:  Negative for chest pain, palpitations and leg swelling.  Gastrointestinal:  Negative for abdominal pain, blood in stool, constipation, diarrhea and nausea.  Endocrine: Negative for polydipsia.  Genitourinary:  Negative for dysuria, frequency, hematuria and urgency.  Musculoskeletal:  Negative for back pain, myalgias and neck pain.  Skin:  Negative for rash.  Allergic/Immunologic: Negative for environmental allergies.  Neurological:  Negative for dizziness and headaches.  Hematological:  Does not bruise/bleed easily.  Psychiatric/Behavioral:  Negative for suicidal ideas. The patient is not nervous/anxious.     Patient Active Problem List   Diagnosis Date Noted   Atherosclerosis of abdominal aorta (HCC) 12/17/2020   Mixed hyperlipidemia 01/01/2017   HDL lipoprotein deficiency 01/01/2017   H/O hypercholesterolemia 12/20/2014   Flu vaccine need 12/20/2014   Need for vaccination 12/20/2014   Umbilical hernia without obstruction and without gangrene 12/20/2014   Familial multiple lipoprotein-type hyperlipidemia 12/08/2014   Alimentary obesity 12/08/2014   Type 2 diabetes mellitus with complication, without long-term current use of insulin (HCC) 12/08/2014   Essential (primary) hypertension 12/08/2014   Hematuria 12/08/2014   Routine general medical examination at a health care facility 12/08/2014   Screening for depression 12/08/2014    No Known Allergies  Past Surgical History:  Procedure Laterality Date   CATARACT EXTRACTION Bilateral 2003   CYSTOSCOPY W/ RETROGRADES Bilateral 02/26/2015   Procedure: CYSTOSCOPY WITH RETROGRADE PYELOGRAM;  Surgeon: Vanna Scotland, MD;  Location: [facility]  [facility];  Service: Urology;  Laterality: Bilateral;   HERNIA REPAIR  2015    Social History   Tobacco Use   Smoking status: Former    Current packs/day: 0.00    Types: Cigarettes    Quit date: 02/23/1975    Years since quitting: 48.2   Smokeless tobacco:  Former    Types: Chew    Quit date: 08/28/2018   Tobacco comments:    smoked for only 6 mos out of his whole life  Vaping Use   Vaping status: Never Used  Substance Use Topics   Alcohol use: No    Alcohol/week: 0.0 standard drinks of alcohol   Drug use: No     Medication list has been reviewed and updated.  Current Meds  Medication Sig   aspirin 81 MG EC tablet Take 1 tablet (81 mg total) by mouth daily. Swallow whole.   atorvastatin (LIPITOR) 40 MG tablet TAKE 1 TABLET BY MOUTH EVERY DAY   lisinopril (ZESTRIL) 5 MG tablet TAKE 1 TABLET (5 MG TOTAL) BY MOUTH DAILY.   metFORMIN (GLUCOPHAGE-XR) 500 MG 24 hr tablet TAKE 1 TABLET BY MOUTH EVERY DAY WITH BREAKFAST   Omega-3 Fatty Acids (FISH OIL PO) Take 1 capsule by mouth daily. Reported on 08/08/2015   pioglitazone (ACTOS) 15 MG tablet Take 1 tablet (15 mg total) by mouth daily.       05/12/2023    9:44 AM 12/23/2022   10:27 AM 08/21/2022   10:17 AM 04/21/2022   10:33 AM  GAD 7 : Generalized Anxiety Score  Nervous, Anxious, on Edge 0 0 0 0  Control/stop worrying 0 0 0 0  Worry too much - different things 0 0 0 0  Trouble relaxing 0 0 0 0  Restless 0 0 0 0  Easily annoyed or irritable 0 0 0 0  Afraid - awful might happen 0 0 0 0  Total GAD 7 Score 0 0 0 0  Anxiety Difficulty Not difficult at all Not difficult at all Not difficult at all Not difficult at all       05/12/2023    9:44 AM 02/25/2023    9:32 AM 02/25/2023    9:29 AM  Depression screen PHQ 2/9  Decreased Interest 0 0 0  Down, Depressed, Hopeless 0 0 1  PHQ - 2 Score 0 0 1  Altered sleeping 0    Tired, decreased energy 0    Change in appetite 0    Feeling bad or failure about yourself  0    Trouble concentrating 0    Moving slowly or fidgety/restless 0    Suicidal thoughts 0    PHQ-9 Score 0    Difficult doing work/chores Not difficult at all      BP Readings from Last 3 Encounters:  05/12/23 122/74  12/23/22 120/78  08/21/22 128/60    Physical  Exam Vitals and nursing note reviewed.  HENT:     Head: Normocephalic.     Right Ear: Tympanic membrane and external ear normal.     Left Ear: Tympanic membrane and external ear normal.     Nose: Nose normal. No congestion.  Eyes:     General: No scleral icterus.       Right eye: No discharge.        Left eye: No discharge.     Conjunctiva/sclera: Conjunctivae normal.     Pupils: Pupils are equal, round, and reactive to light.  Neck:     Thyroid: No  thyromegaly.     Vascular: No JVD.     Trachea: No tracheal deviation.  Cardiovascular:     Rate and Rhythm: Normal rate and regular rhythm.     Heart sounds: Normal heart sounds. No murmur heard.    No friction rub. No gallop.  Pulmonary:     Effort: No respiratory distress.     Breath sounds: Normal breath sounds. No wheezing, rhonchi or rales.  Chest:     Chest wall: No tenderness.  Abdominal:     General: Bowel sounds are normal.     Palpations: Abdomen is soft. There is no mass.     Tenderness: There is no abdominal tenderness. There is no guarding or rebound.  Musculoskeletal:        General: No tenderness. Normal range of motion.     Cervical back: Normal range of motion and neck supple.  Lymphadenopathy:     Cervical: No cervical adenopathy.  Skin:    General: Skin is warm.     Findings: No rash.  Neurological:     Mental Status: He is alert.     Wt Readings from Last 3 Encounters:  05/12/23 272 lb 9.6 oz (123.7 kg)  02/25/23 278 lb (126.1 kg)  12/23/22 278 lb (126.1 kg)    BP 122/74 (BP Location: Left Arm, Patient Position: Sitting, Cuff Size: Normal)   Pulse 72   Ht 6\' 1"  (1.854 m)   Wt 272 lb 9.6 oz (123.7 kg)   SpO2 95%   BMI 35.97 kg/m   Assessment and Plan:  1. Essential (primary) hypertension Chronic.  Controlled.  Stable.  Blood pressure 122/74.  Asymptomatic.  Tolerating medication well.  Continue lisinopril 5 mg once a day and continue with low sodium intake.  Will check CMP for electrolytes  and GFR. - lisinopril (ZESTRIL) 5 MG tablet; Take 1 tablet (5 mg total) by mouth daily.  Dispense: 90 tablet; Refill: 1 - Comprehensive metabolic panel  2. Familial multiple lipoprotein-type hyperlipidemia Chronic.  Controlled.  Stable.  Continue atorvastatin 40 mg once a day.  Will check CMP for electrolytes and hepatic function.  Review of previous lipid panel was acceptable. - atorvastatin (LIPITOR) 40 MG tablet; Take 1 tablet (40 mg total) by mouth daily.  Dispense: 90 tablet; Refill: 1 - Comprehensive metabolic panel  3. Type 2 diabetes mellitus with complication, without long-term current use of insulin (HCC) Chronic.  Controlled.  Stable.  Continue metformin XR 500 mg daily and pioglitazone 15 mg daily.  Will check A1c for current status. - metFORMIN (GLUCOPHAGE-XR) 500 MG 24 hr tablet; TAKE 1 TABLET BY MOUTH EVERY DAY WITH BREAKFAST  Dispense: 90 tablet; Refill: 1 - pioglitazone (ACTOS) 15 MG tablet; Take 1 tablet (15 mg total) by mouth daily.  Dispense: 90 tablet; Refill: 1 - Hemoglobin A1c  4. Need for immunization against influenza Discussed and administered. - Flu Vaccine Trivalent High Dose (Fluad)    Elizabeth Sauer, MD

## 2023-05-13 LAB — COMPREHENSIVE METABOLIC PANEL
ALT: 13 [IU]/L (ref 0–44)
AST: 21 [IU]/L (ref 0–40)
Albumin: 4.1 g/dL (ref 3.7–4.7)
Alkaline Phosphatase: 109 [IU]/L (ref 44–121)
BUN/Creatinine Ratio: 9 — ABNORMAL LOW (ref 10–24)
BUN: 16 mg/dL (ref 8–27)
Bilirubin Total: 1.5 mg/dL — ABNORMAL HIGH (ref 0.0–1.2)
CO2: 23 mmol/L (ref 20–29)
Calcium: 9.6 mg/dL (ref 8.6–10.2)
Chloride: 106 mmol/L (ref 96–106)
Creatinine, Ser: 1.81 mg/dL — ABNORMAL HIGH (ref 0.76–1.27)
Globulin, Total: 2.4 g/dL (ref 1.5–4.5)
Glucose: 134 mg/dL — ABNORMAL HIGH (ref 70–99)
Potassium: 4.6 mmol/L (ref 3.5–5.2)
Sodium: 144 mmol/L (ref 134–144)
Total Protein: 6.5 g/dL (ref 6.0–8.5)
eGFR: 36 mL/min/{1.73_m2} — ABNORMAL LOW (ref >59)

## 2023-05-13 LAB — HEMOGLOBIN A1C
Est. average glucose Bld gHb Est-mCnc: 137 mg/dL
Hgb A1c MFr Bld: 6.4 % — ABNORMAL HIGH (ref 4.8–5.6)

## 2023-05-15 ENCOUNTER — Ambulatory Visit: Payer: PPO | Admitting: Family Medicine

## 2023-05-15 ENCOUNTER — Telehealth: Payer: Self-pay | Admitting: Family Medicine

## 2023-05-15 NOTE — Telephone Encounter (Signed)
Called patient in regard to lab results. The number listed in patient's chart is the wrong number. A lady answer the phone and stated that she just got the number a few days ago. Please advise.

## 2023-05-18 NOTE — Progress Notes (Signed)
Called pt the number listed is the wrong number. Labs printed and mailed.  KP

## 2023-09-14 ENCOUNTER — Encounter: Payer: Self-pay | Admitting: Family Medicine

## 2023-09-14 ENCOUNTER — Ambulatory Visit (INDEPENDENT_AMBULATORY_CARE_PROVIDER_SITE_OTHER): Payer: PPO | Admitting: Family Medicine

## 2023-09-14 VITALS — BP 117/76 | HR 68 | Resp 16 | Ht 73.0 in | Wt 273.4 lb

## 2023-09-14 DIAGNOSIS — N1832 Chronic kidney disease, stage 3b: Secondary | ICD-10-CM

## 2023-09-14 DIAGNOSIS — I10 Essential (primary) hypertension: Secondary | ICD-10-CM

## 2023-09-14 DIAGNOSIS — E7849 Other hyperlipidemia: Secondary | ICD-10-CM

## 2023-09-14 DIAGNOSIS — Z7984 Long term (current) use of oral hypoglycemic drugs: Secondary | ICD-10-CM | POA: Diagnosis not present

## 2023-09-14 MED ORDER — LISINOPRIL 5 MG PO TABS: 5.0000 mg | ORAL_TABLET | Freq: Every day | ORAL | 1 refills | Status: AC

## 2023-09-14 MED ORDER — METFORMIN HCL ER 500 MG PO TB24: ORAL_TABLET | ORAL | 1 refills | Status: AC

## 2023-09-14 MED ORDER — PIOGLITAZONE HCL 15 MG PO TABS: 15.0000 mg | ORAL_TABLET | Freq: Every day | ORAL | 1 refills | Status: AC

## 2023-09-14 MED ORDER — ATORVASTATIN CALCIUM 40 MG PO TABS: 40.0000 mg | ORAL_TABLET | Freq: Every day | ORAL | 1 refills | Status: AC

## 2023-09-14 NOTE — Progress Notes (Signed)
Date:  09/14/2023   Name:  Troy Rodriguez   DOB:  [birth year]   MRN:  [id number]   Chief Complaint: Hypertension, Hyperlipidemia, and Diabetes Mellitus  Hypertension This is a chronic problem. The current episode started more than 1 year ago. The problem has been gradually improving since onset. The problem is controlled. Pertinent negatives include no anxiety, blurred vision, chest pain, headaches, malaise/fatigue, neck pain, orthopnea, palpitations, peripheral edema, PND, shortness of breath or sweats. There are no associated agents to hypertension. Risk factors for coronary artery disease include diabetes mellitus, dyslipidemia, male gender and obesity. Past treatments include ACE inhibitors. The current treatment provides moderate improvement. There are no compliance problems.  There is no history of kidney disease, CAD/MI or CVA. There is no history of chronic renal disease, a hypertension causing med or renovascular disease.  Hyperlipidemia This is a chronic problem. The current episode started more than 1 year ago. The problem is controlled. Recent lipid tests were reviewed and are normal. Exacerbating diseases include diabetes and obesity. He has no history of chronic renal disease or hypothyroidism. Factors aggravating his hyperlipidemia include thiazides. Pertinent negatives include no chest pain, focal sensory loss, focal weakness, leg pain, myalgias or shortness of breath. Current antihyperlipidemic treatment includes statins. The current treatment provides moderate improvement of lipids. There are no compliance problems.  Risk factors for coronary artery disease include dyslipidemia, hypertension, post-menopausal and male sex.  Diabetes He presents for his follow-up diabetic visit. He has type 2 diabetes mellitus. His disease course has been stable. There are no hypoglycemic associated symptoms. Pertinent negatives for hypoglycemia include no headaches or sweats. Pertinent negatives for  diabetes include no blurred vision, no chest pain, no fatigue, no foot paresthesias, no foot ulcerations, no polydipsia, no polyuria, no visual change, no weakness and no weight loss. There are no hypoglycemic complications. Symptoms are stable. There are no diabetic complications. Pertinent negatives for diabetic complications include no CVA. Current diabetic treatment includes diet and oral agent (dual therapy). His weight is stable. He is following a generally healthy diet. Meal planning includes avoidance of concentrated sweets and carbohydrate counting. He participates in exercise daily (walk daily). His home blood glucose trend is fluctuating minimally. His breakfast blood glucose range is generally 130-140 mg/dl. An ACE inhibitor/angiotensin II receptor blocker is being taken. Eye exam is current.    Lab Results  Component Value Date   NA 144 05/12/2023   K 4.6 05/12/2023   CO2 23 05/12/2023   GLUCOSE 134 (H) 05/12/2023   BUN 16 05/12/2023   CREATININE 1.81 (H) 05/12/2023   CALCIUM 9.6 05/12/2023   EGFR 36 (L) 05/12/2023   GFRNONAA 44 (L) 09/03/2020   Lab Results  Component Value Date   CHOL 142 12/23/2022   HDL 35 (L) 12/23/2022   LDLCALC 90 12/23/2022   TRIG 89 12/23/2022   CHOLHDL 4.9 04/01/2018   No results found for: "TSH" Lab Results  Component Value Date   HGBA1C 6.4 (H) 05/12/2023   Lab Results  Component Value Date   WBC 4.7 02/20/2015   HGB 15.1 02/20/2015   HCT 45.6 02/20/2015   MCV 91 02/20/2015   PLT 176 02/20/2015   Lab Results  Component Value Date   ALT 13 05/12/2023   AST 21 05/12/2023   ALKPHOS 109 05/12/2023   BILITOT 1.5 (H) 05/12/2023   No results found for: "25OHVITD2", "25OHVITD3", "VD25OH"   Review of Systems  Constitutional:  Negative for fatigue, malaise/fatigue, unexpected weight change  and weight loss.  HENT:  Positive for hearing loss.   Eyes:  Negative for blurred vision.  Respiratory:  Negative for cough, choking, chest tightness,  shortness of breath and wheezing.   Cardiovascular:  Negative for chest pain, palpitations, orthopnea, leg swelling and PND.  Gastrointestinal:  Negative for abdominal distention.  Endocrine: Negative for polydipsia and polyuria.  Genitourinary:  Negative for difficulty urinating.  Musculoskeletal:  Negative for myalgias and neck pain.  Neurological:  Negative for focal weakness, weakness and headaches.    Patient Active Problem List   Diagnosis Date Noted   Atherosclerosis of abdominal aorta (HCC) 12/17/2020   Mixed hyperlipidemia 01/01/2017   HDL lipoprotein deficiency 01/01/2017   H/O hypercholesterolemia 12/20/2014   Flu vaccine need 12/20/2014   Need for vaccination 12/20/2014   Umbilical hernia without obstruction and without gangrene 12/20/2014   Familial multiple lipoprotein-type hyperlipidemia 12/08/2014   Alimentary obesity 12/08/2014   Type 2 diabetes mellitus with complication, without long-term current use of insulin (HCC) 12/08/2014   Essential (primary) hypertension 12/08/2014   Hematuria 12/08/2014   Routine general medical examination at a health care facility 12/08/2014   Screening for depression 12/08/2014    No Known Allergies  Past Surgical History:  Procedure Laterality Date   CATARACT EXTRACTION Bilateral 2003   CYSTOSCOPY W/ RETROGRADES Bilateral 02/26/2015   Procedure: CYSTOSCOPY WITH RETROGRADE PYELOGRAM;  Surgeon: Vanna Scotland, MD;  Location: [facility];  Service: Urology;  Laterality: Bilateral;   HERNIA REPAIR  2015    Social History   Tobacco Use   Smoking status: Former    Current packs/day: 0.00    Types: Cigarettes    Quit date: 02/23/1975    Years since quitting: 48.5   Smokeless tobacco: Former    Types: Chew    Quit date: 08/28/2018   Tobacco comments:    smoked for only 6 mos out of his whole life  Vaping Use   Vaping status: Never Used  Substance Use Topics   Alcohol use: No    Alcohol/week: 0.0 standard drinks of alcohol   Drug  use: No     Medication list has been reviewed and updated.  Current Meds  Medication Sig   aspirin 81 MG EC tablet Take 1 tablet (81 mg total) by mouth daily. Swallow whole.   atorvastatin (LIPITOR) 40 MG tablet Take 1 tablet (40 mg total) by mouth daily.   lisinopril (ZESTRIL) 5 MG tablet Take 1 tablet (5 mg total) by mouth daily.   metFORMIN (GLUCOPHAGE-XR) 500 MG 24 hr tablet TAKE 1 TABLET BY MOUTH EVERY DAY WITH BREAKFAST   Omega-3 Fatty Acids (FISH OIL PO) Take 1 capsule by mouth daily. Reported on 08/08/2015   pioglitazone (ACTOS) 15 MG tablet Take 1 tablet (15 mg total) by mouth daily.       05/12/2023    9:44 AM 12/23/2022   10:27 AM 08/21/2022   10:17 AM 04/21/2022   10:33 AM  GAD 7 : Generalized Anxiety Score  Nervous, Anxious, on Edge 0 0 0 0  Control/stop worrying 0 0 0 0  Worry too much - different things 0 0 0 0  Trouble relaxing 0 0 0 0  Restless 0 0 0 0  Easily annoyed or irritable 0 0 0 0  Afraid - awful might happen 0 0 0 0  Total GAD 7 Score 0 0 0 0  Anxiety Difficulty Not difficult at all Not difficult at all Not difficult at all Not difficult at all  05/12/2023    9:44 AM 02/25/2023    9:32 AM 02/25/2023    9:29 AM  Depression screen PHQ 2/9  Decreased Interest 0 0 0  Down, Depressed, Hopeless 0 0 1  PHQ - 2 Score 0 0 1  Altered sleeping 0    Tired, decreased energy 0    Change in appetite 0    Feeling bad or failure about yourself  0    Trouble concentrating 0    Moving slowly or fidgety/restless 0    Suicidal thoughts 0    PHQ-9 Score 0    Difficult doing work/chores Not difficult at all      BP Readings from Last 3 Encounters:  09/14/23 117/76  05/12/23 122/74  12/23/22 120/78    Physical Exam Vitals and nursing note reviewed.  Constitutional:      Appearance: He is well-developed.  HENT:     Head: Normocephalic and atraumatic.     Right Ear: Tympanic membrane, ear canal and external ear normal.     Left Ear: Tympanic membrane,  ear canal and external ear normal.     Nose: Nose normal.     Mouth/Throat:     Dentition: Normal dentition.  Eyes:     General: Lids are normal. No scleral icterus.    Conjunctiva/sclera: Conjunctivae normal.     Pupils: Pupils are equal, round, and reactive to light.  Neck:     Thyroid: No thyromegaly.     Vascular: No carotid bruit, hepatojugular reflux or JVD.     Trachea: No tracheal deviation.  Cardiovascular:     Rate and Rhythm: Normal rate and regular rhythm.     Heart sounds: Normal heart sounds. No murmur heard.    No gallop.  Pulmonary:     Effort: Pulmonary effort is normal.     Breath sounds: Normal breath sounds. No wheezing, rhonchi or rales.  Abdominal:     General: Bowel sounds are normal.     Palpations: Abdomen is soft. There is no hepatomegaly, splenomegaly or mass.     Tenderness: There is no abdominal tenderness.     Hernia: There is no hernia in the left inguinal area.  Musculoskeletal:        General: Normal range of motion.     Cervical back: Normal range of motion and neck supple.  Lymphadenopathy:     Cervical: No cervical adenopathy.  Skin:    General: Skin is warm and dry.     Findings: No rash.  Neurological:     Mental Status: He is alert and oriented to person, place, and time.     Sensory: No sensory deficit.     Deep Tendon Reflexes: Reflexes are normal and symmetric.  Psychiatric:        Mood and Affect: Mood is not anxious or depressed.     Wt Readings from Last 3 Encounters:  09/14/23 273 lb 6.4 oz (124 kg)  05/12/23 272 lb 9.6 oz (123.7 kg)  02/25/23 278 lb (126.1 kg)    BP 117/76   Pulse 68   Resp 16   Ht 6\' 1"  (1.854 m)   Wt 273 lb 6.4 oz (124 kg)   BMI 36.07 kg/m   Assessment and Plan:  1. Long term current use of oral hypoglycemic drug (Primary) Chronic.  Controlled.  Stable.  A1c last was 6.4 and has been in this range for the most part for the past years.  However patient is on 2 oral medications  which would be of  concern to continue given that he has had decreasing GFR and increasing creatinine on the last renal function.  This will be repeated today and if remains in CKD 3B range we will be referring to nephrology which has been placed already.  Will check A1c and CMP for current level will temporarily continue metformin and pioglitazone until we can get him in with endocrinology and likely will be altering medications.  Referral has been placed with endocrinology to likely make these changes. - Ambulatory referral to Endocrinology - metFORMIN (GLUCOPHAGE-XR) 500 MG 24 hr tablet; TAKE 1 TABLET BY MOUTH EVERY DAY WITH BREAKFAST  Dispense: 90 tablet; Refill: 1 - pioglitazone (ACTOS) 15 MG tablet; Take 1 tablet (15 mg total) by mouth daily.  Dispense: 90 tablet; Refill: 1 - Comprehensive metabolic panel - Hemoglobin A1c  2. Essential (primary) hypertension Chronic.  Controlled.  Stable.  Blood pressure 117/76.  Asymptomatic.  Tolerating medication well.  Continue lisinopril 5 mg once a day.  Will check CMP for electrolytes and GFR. - lisinopril (ZESTRIL) 5 MG tablet; Take 1 tablet (5 mg total) by mouth daily.  Dispense: 90 tablet; Refill: 1 - Comprehensive metabolic panel  3. Stage 3b chronic kidney disease (HCC) For the first time patient's GFR has gone below 40 in October was thinking that this may have been more dehydration we are repeating today however we will go ahead and place a formal consultation with nephrology. - Ambulatory referral to Nephrology - Comprehensive metabolic panel  4. Familial multiple lipoprotein-type hyperlipidemia Chronic.  Controlled.  Stable.  Continue atorvastatin 40 mg once a day.  Will check lipid panel for current LDL control. - atorvastatin (LIPITOR) 40 MG tablet; Take 1 tablet (40 mg total) by mouth daily.  Dispense: 90 tablet; Refill: 1 - Lipid Panel With LDL/HDL Ratio    Elizabeth Sauer, MD

## 2023-09-15 DIAGNOSIS — N1832 Chronic kidney disease, stage 3b: Secondary | ICD-10-CM | POA: Diagnosis not present

## 2023-09-15 DIAGNOSIS — Z7984 Long term (current) use of oral hypoglycemic drugs: Secondary | ICD-10-CM | POA: Diagnosis not present

## 2023-09-15 DIAGNOSIS — I10 Essential (primary) hypertension: Secondary | ICD-10-CM | POA: Diagnosis not present

## 2023-09-15 DIAGNOSIS — E7849 Other hyperlipidemia: Secondary | ICD-10-CM | POA: Diagnosis not present

## 2023-09-16 LAB — COMPREHENSIVE METABOLIC PANEL
ALT: 14 [IU]/L (ref 0–44)
AST: 27 [IU]/L (ref 0–40)
Albumin: 4.1 g/dL (ref 3.7–4.7)
Alkaline Phosphatase: 147 [IU]/L — ABNORMAL HIGH (ref 44–121)
BUN/Creatinine Ratio: 12 (ref 10–24)
BUN: 19 mg/dL (ref 8–27)
Bilirubin Total: 1.1 mg/dL (ref 0.0–1.2)
CO2: 21 mmol/L (ref 20–29)
Calcium: 9.4 mg/dL (ref 8.6–10.2)
Chloride: 103 mmol/L (ref 96–106)
Creatinine, Ser: 1.6 mg/dL — ABNORMAL HIGH (ref 0.76–1.27)
Globulin, Total: 2.3 g/dL (ref 1.5–4.5)
Glucose: 136 mg/dL — ABNORMAL HIGH (ref 70–99)
Potassium: 4.8 mmol/L (ref 3.5–5.2)
Sodium: 141 mmol/L (ref 134–144)
Total Protein: 6.4 g/dL (ref 6.0–8.5)
eGFR: 42 mL/min/{1.73_m2} — ABNORMAL LOW (ref >59)

## 2023-09-16 LAB — LIPID PANEL WITH LDL/HDL RATIO
Cholesterol, Total: 131 mg/dL (ref 100–199)
HDL: 36 mg/dL — ABNORMAL LOW (ref >39)
LDL Chol Calc (NIH): 79 mg/dL (ref 0–99)
LDL/HDL Ratio: 2.2 {ratio} (ref 0.0–3.6)
Triglycerides: 83 mg/dL (ref 0–149)
VLDL Cholesterol Cal: 16 mg/dL (ref 5–40)

## 2023-09-16 LAB — HEMOGLOBIN A1C
Est. average glucose Bld gHb Est-mCnc: 137 mg/dL
Hgb A1c MFr Bld: 6.4 % — ABNORMAL HIGH (ref 4.8–5.6)

## 2023-09-17 ENCOUNTER — Other Ambulatory Visit: Payer: Self-pay

## 2023-09-17 NOTE — Progress Notes (Signed)
Dr. Yetta Barre this patients phone number is incorrect in our system.

## 2023-10-06 DIAGNOSIS — E1159 Type 2 diabetes mellitus with other circulatory complications: Secondary | ICD-10-CM | POA: Diagnosis not present

## 2023-10-06 DIAGNOSIS — E1122 Type 2 diabetes mellitus with diabetic chronic kidney disease: Secondary | ICD-10-CM | POA: Diagnosis not present

## 2023-10-06 DIAGNOSIS — N1832 Chronic kidney disease, stage 3b: Secondary | ICD-10-CM | POA: Diagnosis not present

## 2023-10-06 DIAGNOSIS — E785 Hyperlipidemia, unspecified: Secondary | ICD-10-CM | POA: Diagnosis not present

## 2023-10-06 DIAGNOSIS — E1169 Type 2 diabetes mellitus with other specified complication: Secondary | ICD-10-CM | POA: Diagnosis not present

## 2023-10-06 DIAGNOSIS — I152 Hypertension secondary to endocrine disorders: Secondary | ICD-10-CM | POA: Diagnosis not present

## 2024-02-17 DIAGNOSIS — I152 Hypertension secondary to endocrine disorders: Secondary | ICD-10-CM | POA: Diagnosis not present

## 2024-02-17 DIAGNOSIS — N1832 Chronic kidney disease, stage 3b: Secondary | ICD-10-CM | POA: Diagnosis not present

## 2024-02-17 DIAGNOSIS — E1169 Type 2 diabetes mellitus with other specified complication: Secondary | ICD-10-CM | POA: Diagnosis not present

## 2024-02-17 DIAGNOSIS — E785 Hyperlipidemia, unspecified: Secondary | ICD-10-CM | POA: Diagnosis not present

## 2024-02-17 DIAGNOSIS — E1159 Type 2 diabetes mellitus with other circulatory complications: Secondary | ICD-10-CM | POA: Diagnosis not present

## 2024-02-17 DIAGNOSIS — E1122 Type 2 diabetes mellitus with diabetic chronic kidney disease: Secondary | ICD-10-CM | POA: Diagnosis not present

## 2024-03-07 DIAGNOSIS — E782 Mixed hyperlipidemia: Secondary | ICD-10-CM | POA: Diagnosis not present

## 2024-03-07 DIAGNOSIS — Z1331 Encounter for screening for depression: Secondary | ICD-10-CM | POA: Diagnosis not present

## 2024-03-07 DIAGNOSIS — E118 Type 2 diabetes mellitus with unspecified complications: Secondary | ICD-10-CM | POA: Diagnosis not present

## 2024-03-07 DIAGNOSIS — I10 Essential (primary) hypertension: Secondary | ICD-10-CM | POA: Diagnosis not present

## 2024-06-14 DIAGNOSIS — Z0000 Encounter for general adult medical examination without abnormal findings: Secondary | ICD-10-CM | POA: Diagnosis not present

## 2024-06-14 DIAGNOSIS — E782 Mixed hyperlipidemia: Secondary | ICD-10-CM | POA: Diagnosis not present

## 2024-06-14 DIAGNOSIS — Z1331 Encounter for screening for depression: Secondary | ICD-10-CM | POA: Diagnosis not present

## 2024-06-14 DIAGNOSIS — I10 Essential (primary) hypertension: Secondary | ICD-10-CM | POA: Diagnosis not present

## 2024-06-14 DIAGNOSIS — E118 Type 2 diabetes mellitus with unspecified complications: Secondary | ICD-10-CM | POA: Diagnosis not present
